# Patient Record
Sex: Female | Born: 1945 | Race: White | Hispanic: Yes | Marital: Single | Smoking: Never smoker
Health system: Southern US, Community
[De-identification: ages and names within clinical notes are randomized; demographics above are authoritative.]

## PROBLEM LIST (undated history)

## (undated) DIAGNOSIS — E669 Obesity, unspecified: Secondary | ICD-10-CM

## (undated) DIAGNOSIS — C541 Malignant neoplasm of endometrium: Secondary | ICD-10-CM

## (undated) DIAGNOSIS — M1990 Unspecified osteoarthritis, unspecified site: Secondary | ICD-10-CM

## (undated) DIAGNOSIS — R0600 Dyspnea, unspecified: Secondary | ICD-10-CM

## (undated) HISTORY — PX: BREAST SURGERY: SHX581

## (undated) HISTORY — DX: Obesity, unspecified: E66.9

## (undated) HISTORY — PX: CHOLECYSTECTOMY: SHX55

## (undated) HISTORY — PX: DILATION AND CURETTAGE OF UTERUS: SHX78

## (undated) HISTORY — DX: Malignant neoplasm of endometrium: C54.1

---

## 2020-05-01 ENCOUNTER — Other Ambulatory Visit: Payer: Self-pay

## 2020-05-01 ENCOUNTER — Encounter (HOSPITAL_BASED_OUTPATIENT_CLINIC_OR_DEPARTMENT_OTHER): Payer: Self-pay | Admitting: Obstetrics and Gynecology

## 2020-05-01 NOTE — Progress Notes (Signed)
Spoke w/ via phone for pre-op interview--- Gillian Shields RN  Lab needs dos----  T/s cbc              Lab results------none  COVID test ------10/16 at 1030 Arrive at -------1100am NPO after MN NO Solid Food.  Clear liquids from MN until--0945am- Medications to take morning of surgery -----none  Diabetic medication -----none  Patient Special Instructions -----none  Pre-Op special Istructions -----none  Patient verbalized understanding of instructions that were given at this phone interview. Patient denies shortness of breath, chest pain, fever, cough at this phone interview.

## 2020-05-04 ENCOUNTER — Other Ambulatory Visit (HOSPITAL_COMMUNITY)
Admission: RE | Admit: 2020-05-04 | Discharge: 2020-05-04 | Disposition: A | Discharge status: Home / Self Care | Payer: Medicare PPO | Source: Ambulatory Visit | Attending: Obstetrics and Gynecology | Admitting: Obstetrics and Gynecology

## 2020-05-04 DIAGNOSIS — Z01812 Encounter for preprocedural laboratory examination: Secondary | ICD-10-CM | POA: Insufficient documentation

## 2020-05-04 DIAGNOSIS — Z20822 Contact with and (suspected) exposure to covid-19: Secondary | ICD-10-CM | POA: Diagnosis not present

## 2020-05-04 LAB — SARS CORONAVIRUS 2 (TAT 6-24 HRS): SARS Coronavirus 2: NEGATIVE

## 2020-05-07 ENCOUNTER — Ambulatory Visit (HOSPITAL_BASED_OUTPATIENT_CLINIC_OR_DEPARTMENT_OTHER)
Admission: RE | Admit: 2020-05-07 | Discharge: 2020-05-07 | Disposition: A | Discharge status: Home / Self Care | Payer: Medicare PPO | Attending: Obstetrics and Gynecology | Admitting: Obstetrics and Gynecology

## 2020-05-07 ENCOUNTER — Other Ambulatory Visit: Payer: Self-pay

## 2020-05-07 ENCOUNTER — Ambulatory Visit (HOSPITAL_BASED_OUTPATIENT_CLINIC_OR_DEPARTMENT_OTHER): Payer: Medicare PPO | Admitting: Anesthesiology

## 2020-05-07 ENCOUNTER — Encounter (HOSPITAL_BASED_OUTPATIENT_CLINIC_OR_DEPARTMENT_OTHER)
Admission: RE | Disposition: A | Discharge status: Home / Self Care | Payer: Self-pay | Source: Home / Self Care | Attending: Obstetrics and Gynecology

## 2020-05-07 ENCOUNTER — Encounter (HOSPITAL_BASED_OUTPATIENT_CLINIC_OR_DEPARTMENT_OTHER): Payer: Self-pay | Admitting: Obstetrics and Gynecology

## 2020-05-07 DIAGNOSIS — N840 Polyp of corpus uteri: Secondary | ICD-10-CM | POA: Insufficient documentation

## 2020-05-07 DIAGNOSIS — Z6841 Body Mass Index (BMI) 40.0 and over, adult: Secondary | ICD-10-CM | POA: Insufficient documentation

## 2020-05-07 DIAGNOSIS — N950 Postmenopausal bleeding: Secondary | ICD-10-CM | POA: Insufficient documentation

## 2020-05-07 DIAGNOSIS — N841 Polyp of cervix uteri: Secondary | ICD-10-CM | POA: Diagnosis not present

## 2020-05-07 HISTORY — PX: HYSTEROSCOPY WITH D & C: SHX1775

## 2020-05-07 HISTORY — DX: Dyspnea, unspecified: R06.00

## 2020-05-07 LAB — CBC
HCT: 42.4 % (ref 36.0–46.0)
Hemoglobin: 13.4 g/dL (ref 12.0–15.0)
MCH: 27.7 pg (ref 26.0–34.0)
MCHC: 31.6 g/dL (ref 30.0–36.0)
MCV: 87.6 fL (ref 80.0–100.0)
Platelets: 219 10*3/uL (ref 150–400)
RBC: 4.84 MIL/uL (ref 3.87–5.11)
RDW: 13.2 % (ref 11.5–15.5)
WBC: 9.7 10*3/uL (ref 4.0–10.5)
nRBC: 0 % (ref 0.0–0.2)

## 2020-05-07 SURGERY — DILATATION AND CURETTAGE /HYSTEROSCOPY
Anesthesia: General | Site: Vagina

## 2020-05-07 MED ORDER — LIDOCAINE 2% (20 MG/ML) 5 ML SYRINGE
INTRAMUSCULAR | Status: DC | PRN
  Administered 2020-05-07: 100 mg via INTRAVENOUS

## 2020-05-07 MED ORDER — OXYCODONE HCL 5 MG/5ML PO SOLN: 5.0000 mg | Freq: Once | ORAL | Status: DC | PRN

## 2020-05-07 MED ORDER — PROPOFOL 10 MG/ML IV BOLUS
INTRAVENOUS | Status: DC | PRN
  Administered 2020-05-07: 150 mg via INTRAVENOUS

## 2020-05-07 MED ORDER — OXYCODONE HCL 5 MG PO TABS: 5.0000 mg | ORAL_TABLET | Freq: Once | ORAL | Status: DC | PRN

## 2020-05-07 MED ORDER — LIDOCAINE 2% (20 MG/ML) 5 ML SYRINGE
INTRAMUSCULAR | Status: AC
  Filled 2020-05-07: qty 5

## 2020-05-07 MED ORDER — FENTANYL CITRATE (PF) 100 MCG/2ML IJ SOLN
INTRAMUSCULAR | Status: DC | PRN
  Administered 2020-05-07 (×2): 50 ug via INTRAVENOUS

## 2020-05-07 MED ORDER — ONDANSETRON HCL 4 MG/2ML IJ SOLN
INTRAMUSCULAR | Status: AC
  Filled 2020-05-07: qty 2

## 2020-05-07 MED ORDER — SUGAMMADEX SODIUM 500 MG/5ML IV SOLN
INTRAVENOUS | Status: AC
  Filled 2020-05-07: qty 5

## 2020-05-07 MED ORDER — FENTANYL CITRATE (PF) 100 MCG/2ML IJ SOLN: 25.0000 ug | INTRAMUSCULAR | Status: DC | PRN

## 2020-05-07 MED ORDER — LACTATED RINGERS IV SOLN
INTRAVENOUS | Status: DC
  Administered 2020-05-07: 1000 mL via INTRAVENOUS

## 2020-05-07 MED ORDER — DEXAMETHASONE SODIUM PHOSPHATE 10 MG/ML IJ SOLN
INTRAMUSCULAR | Status: DC | PRN
  Administered 2020-05-07: 5 mg via INTRAVENOUS

## 2020-05-07 MED ORDER — ONDANSETRON HCL 4 MG/2ML IJ SOLN
INTRAMUSCULAR | Status: DC | PRN
  Administered 2020-05-07: 4 mg via INTRAVENOUS

## 2020-05-07 MED ORDER — ACETAMINOPHEN 10 MG/ML IV SOLN: 1000.0000 mg | Freq: Once | INTRAVENOUS | Status: DC | PRN

## 2020-05-07 MED ORDER — DEXAMETHASONE SODIUM PHOSPHATE 10 MG/ML IJ SOLN
INTRAMUSCULAR | Status: AC
  Filled 2020-05-07: qty 1

## 2020-05-07 MED ORDER — SUGAMMADEX SODIUM 200 MG/2ML IV SOLN
INTRAVENOUS | Status: DC | PRN
  Administered 2020-05-07: 240 mg via INTRAVENOUS

## 2020-05-07 MED ORDER — ROCURONIUM BROMIDE 10 MG/ML (PF) SYRINGE
PREFILLED_SYRINGE | INTRAVENOUS | Status: DC | PRN
  Administered 2020-05-07: 20 mg via INTRAVENOUS

## 2020-05-07 MED ORDER — SUCCINYLCHOLINE CHLORIDE 200 MG/10ML IV SOSY
PREFILLED_SYRINGE | INTRAVENOUS | Status: DC | PRN
  Administered 2020-05-07: 140 mg via INTRAVENOUS

## 2020-05-07 MED ORDER — PROPOFOL 10 MG/ML IV BOLUS
INTRAVENOUS | Status: AC
  Filled 2020-05-07: qty 20

## 2020-05-07 MED ORDER — SODIUM CHLORIDE 0.9 % IV SOLN
2.0000 g | INTRAVENOUS | Status: AC
  Administered 2020-05-07: 2 g via INTRAVENOUS

## 2020-05-07 MED ORDER — LACTATED RINGERS IV SOLN: INTRAVENOUS | Status: DC

## 2020-05-07 MED ORDER — SODIUM CHLORIDE (PF) 0.9 % IJ SOLN
INTRAMUSCULAR | Status: AC
  Filled 2020-05-07: qty 10

## 2020-05-07 MED ORDER — FENTANYL CITRATE (PF) 100 MCG/2ML IJ SOLN
INTRAMUSCULAR | Status: AC
  Filled 2020-05-07: qty 2

## 2020-05-07 MED ORDER — SODIUM CHLORIDE 0.9 % IV SOLN
INTRAVENOUS | Status: AC
  Filled 2020-05-07: qty 2

## 2020-05-07 MED ORDER — MIDAZOLAM HCL 2 MG/2ML IJ SOLN
INTRAMUSCULAR | Status: AC
  Filled 2020-05-07: qty 2

## 2020-05-07 SURGICAL SUPPLY — 21 items
BIPOLAR CUTTING LOOP 21FR (ELECTRODE)
CANISTER SUCT 3000ML PPV (MISCELLANEOUS) IMPLANT
CATH ROBINSON RED A/P 16FR (CATHETERS) ×2 IMPLANT
COUNTER NEEDLE 1200 MAGNETIC (NEEDLE) IMPLANT
COVER WAND RF STERILE (DRAPES) ×2 IMPLANT
DILATOR CANAL MILEX (MISCELLANEOUS) IMPLANT
DRSG TELFA 3X8 NADH (GAUZE/BANDAGES/DRESSINGS) ×2 IMPLANT
GLOVE BIO SURGEON STRL SZ 6.5 (GLOVE) ×2 IMPLANT
GLOVE BIOGEL PI IND STRL 7.5 (GLOVE) ×1 IMPLANT
GLOVE BIOGEL PI INDICATOR 7.5 (GLOVE) ×1
GLOVE SURG SS PI 7.0 STRL IVOR (GLOVE) ×2 IMPLANT
GLOVE SURG SS PI 7.5 STRL IVOR (GLOVE) ×2 IMPLANT
GOWN STRL REUS W/TWL LRG LVL3 (GOWN DISPOSABLE) ×4 IMPLANT
KIT PROCEDURE FLUENT (KITS) IMPLANT
KIT TURNOVER CYSTO (KITS) ×2 IMPLANT
LOOP CUTTING BIPOLAR 21FR (ELECTRODE) IMPLANT
NEEDLE SPNL 25GX3.5 QUINCKE BL (NEEDLE) IMPLANT
PACK VAGINAL MINOR WOMEN LF (CUSTOM PROCEDURE TRAY) ×2 IMPLANT
PAD PREP 24X48 CUFFED NSTRL (MISCELLANEOUS) ×2 IMPLANT
TOWEL OR 17X26 10 PK STRL BLUE (TOWEL DISPOSABLE) ×2 IMPLANT
WATER STERILE IRR 500ML POUR (IV SOLUTION) IMPLANT

## 2020-05-07 NOTE — Brief Op Note (Signed)
05/07/2020  1:22 PM  PATIENT:  Krystal Hill  74 y.o. female  PRE-OPERATIVE DIAGNOSIS:  POSTMENOPAUSAL BLEEDING, THICKENED ENDOMETRIUM  POST-OPERATIVE DIAGNOSIS:  POSTMENOPAUSAL BLEEDING, THICKENED ENDOMETRIUM, Endocervical polypoid mass  PROCEDURE:  Procedure(s): DILATATION AND CURETTAGE (N/A)  SURGEON:  Surgeon(s) and Role:    * Dian Queen, MD - Primary  PHYSICIAN ASSISTANT:   ASSISTANTS: none   ANESTHESIA:   MAC  EBL:  Less than 50 cc  BLOOD ADMINISTERED:none  DRAINS: none   LOCAL MEDICATIONS USED:  NONE  SPECIMEN:  Source of Specimen:  1.  endocervical polypoid mass and 2.  Endometrial currettings   DISPOSITION OF SPECIMEN:  PATHOLOGY  COUNTS:  YES  TOURNIQUET:  * No tourniquets in log *  DICTATION: .Other Dictation: Dictation Number dictated  PLAN OF CARE: Discharge to home after PACU  PATIENT DISPOSITION:  PACU - hemodynamically stable.   Delay start of Pharmacological VTE agent (>24hrs) due to surgical blood loss or risk of bleeding: not applicable

## 2020-05-07 NOTE — Anesthesia Postprocedure Evaluation (Signed)
Anesthesia Post Note  Patient: Krystal Hill  Procedure(s) Performed: DILATATION AND CURETTAGE (N/A Vagina )     Patient location during evaluation: PACU Anesthesia Type: General Level of consciousness: awake and alert and oriented Pain management: pain level controlled Vital Signs Assessment: post-procedure vital signs reviewed and stable Respiratory status: spontaneous breathing, nonlabored ventilation and respiratory function stable Cardiovascular status: blood pressure returned to baseline and stable Postop Assessment: no apparent nausea or vomiting Anesthetic complications: no   No complications documented.  Last Vitals:  Vitals:   05/07/20 1336 05/07/20 1345  BP: (!) 157/66 (!) 153/66  Pulse: 100 95  Resp: 20 (!) 22  Temp: 36.6 C   SpO2: 96% 97%    Last Pain:  Vitals:   05/07/20 1336  TempSrc:   PainSc: 0-No pain                 Karis Rilling A.

## 2020-05-07 NOTE — H&P (Signed)
74 year old female presents for D and C.  Ms. Hoelzel has not had gyn care in some time but presents with heavy postmenopausal bleeding for about 2 weeks Evaluation in the office - ultrasound 25 mm stripe  For D and C She is virginal and EMB in the office is technically not possible  Past Medical History:  Diagnosis Date  . Dyspnea    with exertion    Past Surgical History:  Procedure Laterality Date  . BREAST SURGERY    . CHOLECYSTECTOMY    . DILATION AND CURETTAGE OF UTERUS     Prior to Admission medications   Medication Sig Start Date End Date Taking? Authorizing Provider  ibuprofen (ADVIL) 100 MG/5ML suspension Take 200 mg by mouth every 4 (four) hours as needed.   Yes [provider]  naproxen sodium (ALEVE) 220 MG tablet Take 220 mg by mouth.   Yes [provider]   Allergies Patient has no allergy information on record.  History reviewed. No pertinent family history. Social History   Socioeconomic History  . Marital status: Single    Spouse name: Not on file  . Number of children: 1  . Years of education: 10  . Highest education level: Not on file  Occupational History  . Not on file  Tobacco Use  . Smoking status: Never Smoker  . Smokeless tobacco: Never Used  Vaping Use  . Vaping Use: Never used  Substance and Sexual Activity  . Alcohol use: Never  . Drug use: Never  . Sexual activity: Not on file  Other Topics Concern  . Not on file  Social History Narrative  . Not on file   Social Determinants of Health   Financial Resource Strain:   . Difficulty of Paying Living Expenses: Not on file  Food Insecurity:   . Worried About Charity fundraiser in the Last Year: Not on file  . Ran Out of Food in the Last Year: Not on file  Transportation Needs:   . Lack of Transportation (Medical): Not on file  . Lack of Transportation (Non-Medical): Not on file  Physical Activity:   . Days of Exercise per Week: Not on file  . Minutes of Exercise per  Session: Not on file  Stress:   . Feeling of Stress : Not on file  Social Connections:   . Frequency of Communication with Friends and Family: Not on file  . Frequency of Social Gatherings with Friends and Family: Not on file  . Attends Religious Services: Not on file  . Active Member of Clubs or Organizations: Not on file  . Attends Archivist Meetings: Not on file  . Marital Status: Not on file   Ht 5\' 2"  (1.575 m)   Wt 119.7 kg   BMI 48.29 kg/m  General alert and oriented Lung CTAB Car RRR No results found for this or any previous visit (from the past 24 hour(s)).  IMPRESSION: PMP Bleeding  PLAN: Exam under anesthesia D and C Risks reviewed Consent signed

## 2020-05-07 NOTE — Discharge Instructions (Signed)

## 2020-05-07 NOTE — Transfer of Care (Signed)
Immediate Anesthesia Transfer of Care Note  Patient: Krystal Hill  Procedure(s) Performed: Procedure(s) (LRB): DILATATION AND CURETTAGE (N/A)  Patient Location: PACU  Anesthesia Type: General  Level of Consciousness: awake, oriented, sedated and patient cooperative  Airway & Oxygen Therapy: Patient Spontanous Breathing and Patient connected to face mask oxygen  Post-op Assessment: Report given to PACU RN and Post -op Vital signs reviewed and stable  Post vital signs: Reviewed and stable  Complications: No apparent anesthesia complications Last Vitals:  Vitals Value Taken Time  BP 157/66 05/07/20 1337  Temp 36.6 C 05/07/20 1336  Pulse 96 05/07/20 1342  Resp 21 05/07/20 1342  SpO2 97 % 05/07/20 1342  Vitals shown include unvalidated device data.  Last Pain:  Vitals:   05/07/20 1336  TempSrc:   PainSc: 0-No pain      Patients Stated Pain Goal: 8 (57/84/69 [id number])  Complications: No complications documented.

## 2020-05-07 NOTE — Progress Notes (Signed)
H and P updated No changes Will proceed with D and C Consent signed

## 2020-05-07 NOTE — Anesthesia Procedure Notes (Addendum)
Procedure Name: Intubation Date/Time: 05/07/2020 1:00 PM Performed by: Suan Halter, CRNA Pre-anesthesia Checklist: Patient identified, Emergency Drugs available, Suction available and Patient being monitored Patient Re-evaluated:Patient Re-evaluated prior to induction Oxygen Delivery Method: Circle system utilized Preoxygenation: Pre-oxygenation with 100% oxygen Induction Type: IV induction Ventilation: Mask ventilation without difficulty Laryngoscope Size: Mac and 3 Grade View: Grade II Tube type: Oral Tube size: 7.0 mm Number of attempts: 1 Airway Equipment and Method: Stylet and Oral airway Placement Confirmation: ETT inserted through vocal cords under direct vision,  positive ETCO2 and breath sounds checked- equal and bilateral Secured at: 22 cm Tube secured with: Tape Dental Injury: Teeth and Oropharynx as per pre-operative assessment

## 2020-05-07 NOTE — Anesthesia Preprocedure Evaluation (Addendum)
Anesthesia Evaluation  Patient identified by MRN, date of birth, ID band Patient awake    Reviewed: Allergy & Precautions, NPO status , Patient's Chart, lab work & pertinent test results  Airway Mallampati: II  TM Distance: >3 FB Neck ROM: Full    Dental no notable dental hx.    Pulmonary neg pulmonary ROS,    Pulmonary exam normal breath sounds clear to auscultation + decreased breath sounds      Cardiovascular + DOE  Normal cardiovascular exam Rhythm:Regular Rate:Normal     Neuro/Psych negative neurological ROS  negative psych ROS   GI/Hepatic negative GI ROS, Neg liver ROS,   Endo/Other  Morbid obesity  Renal/GU negative Renal ROS  negative genitourinary   Musculoskeletal negative musculoskeletal ROS (+)   Abdominal (+) + obese,   Peds negative pediatric ROS (+)  Hematology negative hematology ROS (+)   Anesthesia Other Findings   Reproductive/Obstetrics negative OB ROS                             Anesthesia Physical Anesthesia Plan  ASA: III  Anesthesia Plan: General   Post-op Pain Management:    Induction: Intravenous  PONV Risk Score and Plan: 3 and Ondansetron, Dexamethasone and Treatment may vary due to age or medical condition  Airway Management Planned: Oral ETT  Additional Equipment:   Intra-op Plan:   Post-operative Plan: Extubation in OR  Informed Consent: I have reviewed the patients History and Physical, chart, labs and discussed the procedure including the risks, benefits and alternatives for the proposed anesthesia with the patient or authorized representative who has indicated his/her understanding and acceptance.     Dental advisory given  Plan Discussed with: CRNA and Surgeon  Anesthesia Plan Comments: (Crackers at 0830)       Anesthesia Quick Evaluation

## 2020-05-08 ENCOUNTER — Encounter (HOSPITAL_BASED_OUTPATIENT_CLINIC_OR_DEPARTMENT_OTHER): Payer: Self-pay | Admitting: Obstetrics and Gynecology

## 2020-05-08 LAB — SURGICAL PATHOLOGY

## 2020-05-08 NOTE — Op Note (Signed)
[name], [name] MEDICAL RECORD [id number] ACCOUNT [id number] DATE OF BIRTH:[birth year] FACILITY: [facility] LOCATION: [facility] PHYSICIAN:Jin Capote Lynett Fish, MD  OPERATIVE REPORT  DATE OF PROCEDURE:  05/07/2020  PREOPERATIVE DIAGNOSES:  Thickened endometrium and postmenopausal bleeding.  POSTOPERATIVE DIAGNOSIS:  Thickened endometrium, postmenopausal bleeding, large endocervical polypoid mass.  PROCEDURE:  D and C, removal of endocervical polypoid mass as well.  SURGEON:  Dian Queen, MD  ANESTHESIA:  MAC.  ESTIMATED BLOOD LOSS:  Less than 50 mL.  COMPLICATIONS:  None.  DESCRIPTION OF PROCEDURE:  The patient was taken to the operating room after she was consented.  She was administered anesthesia and placed in the lithotomy position.  She was prepped and draped and a timeout was performed.  An in and out catheter was  used to empty the bladder.  Speculum was inserted into the vagina and inside the vagina ____ polypoid mass appeared to be coming out of her cervical external os.  The size of the mass appeared to be may be 2.5 cm and it did not appear to be friable.  I  used polyp forceps to remove the polypoid mass that was visualized coming out of the cervical os.  This was send to pathology.  I then grasped ____ cervical anterior lip with a tenaculum and dilated the cervical internal os using the [facility] dilators.  I  then inserted the sharp curette ____ that appeared to be arising from the anterior lower uterine segment of the uterus.  ____ and tissue was removed.  I then inserted polyp forceps and removed the remainder of the tissue.  The tissue did not appear  grossly to be polypoid is what I saw that was visualized ____.  At the end of the procedure, minimal bleeding was noted.  All sponge, lap and instrument counts were correct x2.  The patient went to recovery room in stable condition.  HN/NUANCE  D:05/07/2020 T:05/07/2020 JOB:[id number]

## 2020-05-08 NOTE — Addendum Note (Signed)
Addendum  created 05/08/20 0752 by Suan Halter, CRNA   Charge Capture section accepted

## 2020-09-04 NOTE — Progress Notes (Addendum)
COVID Vaccine Completed:  x3 Date COVID Vaccine completed:  08-24-19 09-14-19 Has received booster:  05-19-20 COVID vaccine manufacturer: [employer]   Date of COVID positive in last 90 days:  N/A  PCP - No PCP Cardiologist - N/A  Chest x-ray - N/A EKG - N/A Stress Test -  ECHO -  Cardiac Cath -  Pacemaker/ICD device last checked:  Sleep Study - N/A CPAP -   Fasting Blood Sugar - N/A Checks Blood Sugar _____ times a day  Blood Thinner Instructions:N/A Aspirin Instructions: Last Dose:  Activity level:  Can go up a flight of stairs and perform activities of daily living without stopping and without symptoms     Anesthesia review: N/A  Patient denies shortness of breath, fever, cough and chest pain at PAT appointment   Patient verbalized understanding of instructions that were given to them at the PAT appointment. Patient was also instructed that they will need to review over the PAT instructions again at home before surgery.

## 2020-09-04 NOTE — Patient Instructions (Addendum)
DUE TO COVID-19 ONLY ONE VISITOR IS ALLOWED TO COME WITH YOU AND STAY IN THE WAITING ROOM ONLY DURING PRE OP AND PROCEDURE.   IF YOU WILL BE ADMITTED INTO THE HOSPITAL YOU ARE ALLOWED ONE SUPPORT PERSON DURING VISITATION HOURS ONLY (10AM -8PM)   . The support person may change daily. . The support person must pass our screening, gel in and out, and wear a mask at all times, including in the patient's room. . Patients must also wear a mask when staff or their support person are in the room.        Your procedure is scheduled on: Wednesday, 09-09-20   Report to [facility] Main  Entrance   Report to Short Stay at 5:30 AM   [facility])   Call this number if you have problems the morning of surgery [phone]   Do not eat food :After Midnight.   May have liquids until  4:15 AM day of surgery  CLEAR LIQUID DIET  Foods Allowed                                                                     Foods Excluded  Water, Black Coffee and tea, regular and decaf              liquids that you cannot  Plain Jell-O in any flavor  (No red)                                     see through such as: Fruit ices (not with fruit pulp)                                      milk, soups, orange juice              Iced Popsicles (No red)                                      All solid food                                   Apple juices Sports drinks like Gatorade (No red) Lightly seasoned clear broth or consume(fat free)  Sugar, honey syrup       Oral Hygiene is also important to reduce your risk of infection.                                    Remember - BRUSH YOUR TEETH THE MORNING OF SURGERY WITH YOUR REGULAR TOOTHPASTE   Do NOT smoke after Midnight   Take these medicines the morning of surgery with A SIP OF WATER: None                               You may not have any metal on  your body including hair pins, jewelry, and body piercings             Do not wear make-up, lotions, powders,  perfumes/cologne, or deodorant             Do not wear nail polish.  Do not shave  48 hours prior to surgery.    Do not bring valuables to the hospital. [place].   Contacts, dentures or bridgework may not be worn into surgery.   Bring small overnight bag day of surgery.                 Please read over the following fact sheets you were given: IF YOU HAVE QUESTIONS ABOUT YOUR PRE OP INSTRUCTIONS PLEASE CALL  [facility] - Preparing for Surgery Before surgery, you can play an important role.  Because skin is not sterile, your skin needs to be as free of germs as possible.  You can reduce the number of germs on your skin by washing with CHG (chlorahexidine gluconate) soap before surgery.  CHG is an antiseptic cleaner which kills germs and bonds with the skin to continue killing germs even after washing. Please DO NOT use if you have an allergy to CHG or antibacterial soaps.  If your skin becomes reddened/irritated stop using the CHG and inform your nurse when you arrive at Short Stay. Do not shave (including legs and underarms) for at least 48 hours prior to the first CHG shower.  You may shave your face/neck.  Please follow these instructions carefully:  1.  Shower with CHG Soap the night before surgery and the  morning of surgery.  2.  If you choose to wash your hair, wash your hair first as usual with your normal  shampoo.  3.  After you shampoo, rinse your hair and body thoroughly to remove the shampoo.                             4.  Use CHG as you would any other liquid soap.  You can apply chg directly to the skin and wash.  Gently with a scrungie or clean washcloth.  5.  Apply the CHG Soap to your body ONLY FROM THE NECK DOWN.   Do   not use on face/ open                           Wound or open sores. Avoid contact with eyes, ears mouth and   genitals (private parts).                       Wash face,  Genitals  (private parts) with your normal soap.             6.  Wash thoroughly, paying special attention to the area where your    surgery  will be performed.  7.  Thoroughly rinse your body with warm water from the neck down.  8.  DO NOT shower/wash with your normal soap after using and rinsing off the CHG Soap.                9.  Pat yourself dry with a clean towel.            10.  Wear clean pajamas.            11.  Place clean sheets on your bed the night of your first shower and do not  sleep with pets. Day of Surgery : Do not apply any lotions/deodorants the morning of surgery.  Please wear clean clothes to the hospital/surgery center.  FAILURE TO FOLLOW THESE INSTRUCTIONS MAY RESULT IN THE CANCELLATION OF YOUR SURGERY  PATIENT SIGNATURE_________________________________  NURSE SIGNATURE__________________________________  ________________________________________________________________________  WHAT IS A BLOOD TRANSFUSION? Blood Transfusion Information  A transfusion is the replacement of blood or some of its parts. Blood is made up of multiple cells which provide different functions.  Red blood cells carry oxygen and are used for blood loss replacement.  White blood cells fight against infection.  Platelets control bleeding.  Plasma helps clot blood.  Other blood products are available for specialized needs, such as hemophilia or other clotting disorders. BEFORE THE TRANSFUSION  Who gives blood for transfusions?   Healthy volunteers who are fully evaluated to make sure their blood is safe. This is blood bank blood. Transfusion therapy is the safest it has ever been in the practice of medicine. Before blood is taken from a donor, a complete history is taken to make sure that person has no history of diseases nor engages in risky social behavior (examples are intravenous drug use or sexual activity with multiple partners). The donor's travel history is screened to minimize risk of  transmitting infections, such as malaria. The donated blood is tested for signs of infectious diseases, such as HIV and hepatitis. The blood is then tested to be sure it is compatible with you in order to minimize the chance of a transfusion reaction. If you or a relative donates blood, this is often done in anticipation of surgery and is not appropriate for emergency situations. It takes many days to process the donated blood. RISKS AND COMPLICATIONS Although transfusion therapy is very safe and saves many lives, the main dangers of transfusion include:   Getting an infectious disease.  Developing a transfusion reaction. This is an allergic reaction to something in the blood you were given. Every precaution is taken to prevent this. The decision to have a blood transfusion has been considered carefully by your caregiver before blood is given. Blood is not given unless the benefits outweigh the risks. AFTER THE TRANSFUSION  Right after receiving a blood transfusion, you will usually feel much better and more energetic. This is especially true if your red blood cells have gotten low (anemic). The transfusion raises the level of the red blood cells which carry oxygen, and this usually causes an energy increase.  The nurse administering the transfusion will monitor you carefully for complications. HOME CARE INSTRUCTIONS  No special instructions are needed after a transfusion. You may find your energy is better. Speak with your caregiver about any limitations on activity for underlying diseases you may have. SEEK MEDICAL CARE IF:   Your condition is not improving after your transfusion.  You develop redness or irritation at the intravenous (IV) site. SEEK IMMEDIATE MEDICAL CARE IF:  Any of the following symptoms occur over the next 12 hours:  Shaking chills.  You have a temperature by mouth above 102 F (38.9 C), not controlled by medicine.  Chest, back, or muscle pain.  People around you  feel you are not acting correctly or are confused.  Shortness of breath or difficulty breathing.  Dizziness and fainting.  You get a rash or develop hives.  You have a decrease in urine output.  Your urine turns a dark color or changes to pink, red, or brown. Any of the following symptoms occur over the next 10 days:  You have a temperature by mouth above 102 F (38.9 C), not controlled by medicine.  Shortness of breath.  Weakness after normal activity.  The white part of the eye turns yellow (jaundice).  You have a decrease in the amount of urine or are urinating less often.  Your urine turns a dark color or changes to pink, red, or brown. Document Released: 07/01/2000 Document Revised: 09/26/2011 Document Reviewed: 02/18/2008 [facility] Patient Information 2014 [place], [place].  _______________________________________________________________________

## 2020-09-08 ENCOUNTER — Encounter (HOSPITAL_COMMUNITY)
Admission: RE | Admit: 2020-09-08 | Discharge: 2020-09-08 | Disposition: A | Discharge status: Home / Self Care | Payer: Medicare PPO | Source: Ambulatory Visit | Attending: Obstetrics and Gynecology | Admitting: Obstetrics and Gynecology

## 2020-09-08 ENCOUNTER — Encounter (HOSPITAL_COMMUNITY): Payer: Self-pay

## 2020-09-08 ENCOUNTER — Other Ambulatory Visit: Payer: Self-pay

## 2020-09-08 ENCOUNTER — Other Ambulatory Visit (HOSPITAL_COMMUNITY)
Admission: RE | Admit: 2020-09-08 | Discharge: 2020-09-08 | Disposition: A | Discharge status: Home / Self Care | Payer: Medicare PPO | Source: Ambulatory Visit | Attending: Obstetrics and Gynecology | Admitting: Obstetrics and Gynecology

## 2020-09-08 DIAGNOSIS — Z20822 Contact with and (suspected) exposure to covid-19: Secondary | ICD-10-CM | POA: Insufficient documentation

## 2020-09-08 DIAGNOSIS — Z01812 Encounter for preprocedural laboratory examination: Secondary | ICD-10-CM | POA: Insufficient documentation

## 2020-09-08 HISTORY — DX: Unspecified osteoarthritis, unspecified site: M19.90

## 2020-09-08 LAB — CBC
HCT: 35.9 % — ABNORMAL LOW (ref 36.0–46.0)
Hemoglobin: 11 g/dL — ABNORMAL LOW (ref 12.0–15.0)
MCH: 25.4 pg — ABNORMAL LOW (ref 26.0–34.0)
MCHC: 30.6 g/dL (ref 30.0–36.0)
MCV: 82.9 fL (ref 80.0–100.0)
Platelets: 262 10*3/uL (ref 150–400)
RBC: 4.33 MIL/uL (ref 3.87–5.11)
RDW: 14.2 % (ref 11.5–15.5)
WBC: 11.8 10*3/uL — ABNORMAL HIGH (ref 4.0–10.5)
nRBC: 0 % (ref 0.0–0.2)

## 2020-09-08 LAB — SARS CORONAVIRUS 2 (TAT 6-24 HRS): SARS Coronavirus 2: NEGATIVE

## 2020-09-08 NOTE — Anesthesia Preprocedure Evaluation (Addendum)
Anesthesia Evaluation  Patient identified by MRN, date of birth, ID band Patient awake    Reviewed: Allergy & Precautions, NPO status , Patient's Chart, lab work & pertinent test results  Airway Mallampati: III  TM Distance: >3 FB Neck ROM: Full    Dental no notable dental hx. (+) Teeth Intact, Dental Advisory Given   Pulmonary neg pulmonary ROS,    Pulmonary exam normal breath sounds clear to auscultation       Cardiovascular Exercise Tolerance: Good negative cardio ROS Normal cardiovascular exam Rhythm:Regular Rate:Normal     Neuro/Psych negative neurological ROS  negative psych ROS   GI/Hepatic negative GI ROS, Neg liver ROS,   Endo/Other  negative endocrine ROS  Renal/GU negative Renal ROS     Musculoskeletal  (+) Arthritis ,   Abdominal (+) + obese,   Peds  Hematology Lab Results      Component                Value               Date                      WBC                      11.8 (H)            09/08/2020                HGB                      11.0 (L)            09/08/2020                HCT                      35.9 (L)            09/08/2020                MCV                      82.9                09/08/2020                PLT                      262                 09/08/2020              Anesthesia Other Findings   Reproductive/Obstetrics                            Anesthesia Physical Anesthesia Plan  ASA: III  Anesthesia Plan: General   Post-op Pain Management:    Induction: Intravenous  PONV Risk Score and Plan: 4 or greater and Treatment may vary due to age or medical condition, Ondansetron and Dexamethasone  Airway Management Planned: Oral ETT  Additional Equipment: None  Intra-op Plan:   Post-operative Plan: Extubation in OR  Informed Consent: I have reviewed the patients History and Physical, chart, labs and discussed the procedure including the  risks, benefits and alternatives for the proposed anesthesia with the patient or authorized representative who has indicated his/her  understanding and acceptance.     Dental advisory given  Plan Discussed with: CRNA  Anesthesia Plan Comments: (GA w lidocaine infusion + Ketamine 0.5 mg/kg)       Anesthesia Quick Evaluation

## 2020-09-09 ENCOUNTER — Inpatient Hospital Stay (HOSPITAL_COMMUNITY): Payer: Medicare PPO | Admitting: Certified Registered"

## 2020-09-09 ENCOUNTER — Encounter (HOSPITAL_COMMUNITY)
Admission: RE | Disposition: A | Discharge status: Home / Self Care | Payer: Self-pay | Source: Home / Self Care | Attending: Obstetrics and Gynecology

## 2020-09-09 ENCOUNTER — Encounter (HOSPITAL_COMMUNITY): Payer: Self-pay | Admitting: Obstetrics and Gynecology

## 2020-09-09 ENCOUNTER — Other Ambulatory Visit: Payer: Self-pay

## 2020-09-09 ENCOUNTER — Inpatient Hospital Stay (HOSPITAL_COMMUNITY)
Admission: RE | Admit: 2020-09-09 | Discharge: 2020-09-11 | DRG: 740 | Disposition: A | Discharge status: Home / Self Care | Payer: Medicare PPO | Attending: Obstetrics and Gynecology | Admitting: Obstetrics and Gynecology

## 2020-09-09 DIAGNOSIS — Z79899 Other long term (current) drug therapy: Secondary | ICD-10-CM

## 2020-09-09 DIAGNOSIS — N950 Postmenopausal bleeding: Secondary | ICD-10-CM | POA: Diagnosis present

## 2020-09-09 DIAGNOSIS — Z20822 Contact with and (suspected) exposure to covid-19: Secondary | ICD-10-CM | POA: Diagnosis present

## 2020-09-09 DIAGNOSIS — Z9049 Acquired absence of other specified parts of digestive tract: Secondary | ICD-10-CM

## 2020-09-09 DIAGNOSIS — Z6841 Body Mass Index (BMI) 40.0 and over, adult: Secondary | ICD-10-CM | POA: Diagnosis not present

## 2020-09-09 DIAGNOSIS — C541 Malignant neoplasm of endometrium: Secondary | ICD-10-CM | POA: Diagnosis present

## 2020-09-09 HISTORY — PX: HYSTERECTOMY ABDOMINAL WITH SALPINGO-OOPHORECTOMY: SHX6792

## 2020-09-09 LAB — ABO/RH: ABO/RH(D): O POS

## 2020-09-09 LAB — TYPE AND SCREEN
ABO/RH(D): O POS
Antibody Screen: NEGATIVE

## 2020-09-09 SURGERY — HYSTERECTOMY, ABDOMINAL, WITH SALPINGO-OOPHORECTOMY
Anesthesia: General | Laterality: Bilateral

## 2020-09-09 MED ORDER — LACTATED RINGERS IV SOLN: INTRAVENOUS | Status: DC

## 2020-09-09 MED ORDER — SUGAMMADEX SODIUM 200 MG/2ML IV SOLN
INTRAVENOUS | Status: DC | PRN
  Administered 2020-09-09: 240 mg via INTRAVENOUS

## 2020-09-09 MED ORDER — OXYCODONE HCL 5 MG PO TABS: 5.0000 mg | ORAL_TABLET | Freq: Once | ORAL | Status: DC | PRN

## 2020-09-09 MED ORDER — HYDROMORPHONE 1 MG/ML IV SOLN: INTRAVENOUS | Status: DC

## 2020-09-09 MED ORDER — KETOROLAC TROMETHAMINE 30 MG/ML IJ SOLN
INTRAMUSCULAR | Status: AC
  Filled 2020-09-09: qty 1

## 2020-09-09 MED ORDER — OXYCODONE HCL 5 MG PO TABS
ORAL_TABLET | ORAL | Status: AC
  Filled 2020-09-09: qty 1

## 2020-09-09 MED ORDER — KETOROLAC TROMETHAMINE 30 MG/ML IJ SOLN
30.0000 mg | Freq: Once | INTRAMUSCULAR | Status: AC
  Administered 2020-09-09: 30 mg via INTRAVENOUS

## 2020-09-09 MED ORDER — FENTANYL CITRATE (PF) 250 MCG/5ML IJ SOLN
INTRAMUSCULAR | Status: DC | PRN
  Administered 2020-09-09: 100 ug via INTRAVENOUS

## 2020-09-09 MED ORDER — SODIUM CHLORIDE 0.9 % IR SOLN
Status: DC | PRN
  Administered 2020-09-09: 1000 mL

## 2020-09-09 MED ORDER — OXYCODONE HCL 5 MG/5ML PO SOLN: 5.0000 mg | Freq: Once | ORAL | Status: DC | PRN

## 2020-09-09 MED ORDER — ACETAMINOPHEN 10 MG/ML IV SOLN
INTRAVENOUS | Status: AC
  Administered 2020-09-09: 1000 mg via INTRAVENOUS
  Filled 2020-09-09: qty 100

## 2020-09-09 MED ORDER — ONDANSETRON HCL 4 MG/2ML IJ SOLN: 4.0000 mg | Freq: Four times a day (QID) | INTRAMUSCULAR | Status: DC | PRN

## 2020-09-09 MED ORDER — KETAMINE HCL 10 MG/ML IJ SOLN
INTRAMUSCULAR | Status: AC
  Filled 2020-09-09: qty 1

## 2020-09-09 MED ORDER — BUPIVACAINE HCL 0.25 % IJ SOLN
INTRAMUSCULAR | Status: AC
  Filled 2020-09-09: qty 1

## 2020-09-09 MED ORDER — ONDANSETRON HCL 4 MG/2ML IJ SOLN
INTRAMUSCULAR | Status: DC | PRN
  Administered 2020-09-09: 4 mg via INTRAVENOUS

## 2020-09-09 MED ORDER — LIDOCAINE HCL 2 % IJ SOLN
INTRAMUSCULAR | Status: AC
  Filled 2020-09-09: qty 20

## 2020-09-09 MED ORDER — LIDOCAINE 2% (20 MG/ML) 5 ML SYRINGE
INTRAMUSCULAR | Status: DC | PRN
  Administered 2020-09-09: 100 mg via INTRAVENOUS

## 2020-09-09 MED ORDER — BUPIVACAINE HCL (PF) 0.25 % IJ SOLN
INTRAMUSCULAR | Status: DC | PRN
  Administered 2020-09-09: 20 mL

## 2020-09-09 MED ORDER — AMISULPRIDE (ANTIEMETIC) 5 MG/2ML IV SOLN: 10.0000 mg | Freq: Once | INTRAVENOUS | Status: DC | PRN

## 2020-09-09 MED ORDER — DEXAMETHASONE SODIUM PHOSPHATE 10 MG/ML IJ SOLN
INTRAMUSCULAR | Status: DC | PRN
  Administered 2020-09-09: 8 mg via INTRAVENOUS

## 2020-09-09 MED ORDER — LIDOCAINE HCL (PF) 2 % IJ SOLN
INTRAMUSCULAR | Status: DC | PRN
  Administered 2020-09-09: 1.5 mg/kg/h via INTRADERMAL

## 2020-09-09 MED ORDER — OXYCODONE HCL 5 MG PO TABS
5.0000 mg | ORAL_TABLET | ORAL | Status: DC | PRN
  Filled 2020-09-09: qty 2

## 2020-09-09 MED ORDER — DIPHENHYDRAMINE HCL 12.5 MG/5ML PO ELIX: 12.5000 mg | ORAL_SOLUTION | Freq: Four times a day (QID) | ORAL | Status: DC | PRN

## 2020-09-09 MED ORDER — KETAMINE HCL 10 MG/ML IJ SOLN
INTRAMUSCULAR | Status: DC | PRN
  Administered 2020-09-09: 25 mg via INTRAVENOUS

## 2020-09-09 MED ORDER — LIDOCAINE HCL (PF) 2 % IJ SOLN
INTRAMUSCULAR | Status: AC
  Filled 2020-09-09: qty 5

## 2020-09-09 MED ORDER — TRAMADOL HCL 50 MG PO TABS: 50.0000 mg | ORAL_TABLET | Freq: Four times a day (QID) | ORAL | Status: DC | PRN

## 2020-09-09 MED ORDER — PROPOFOL 10 MG/ML IV BOLUS
INTRAVENOUS | Status: AC
  Filled 2020-09-09: qty 40

## 2020-09-09 MED ORDER — ACETAMINOPHEN 10 MG/ML IV SOLN
1000.0000 mg | Freq: Once | INTRAVENOUS | Status: DC | PRN
Start: 2020-09-09 — End: 2020-09-09

## 2020-09-09 MED ORDER — CHLORHEXIDINE GLUCONATE 0.12 % MT SOLN: 15.0000 mL | Freq: Once | OROMUCOSAL | Status: AC

## 2020-09-09 MED ORDER — LABETALOL HCL 5 MG/ML IV SOLN
INTRAVENOUS | Status: AC
  Filled 2020-09-09: qty 4

## 2020-09-09 MED ORDER — POVIDONE-IODINE 10 % EX SWAB
2.0000 "application " | Freq: Once | CUTANEOUS | Status: AC
  Administered 2020-09-09: 2 via TOPICAL

## 2020-09-09 MED ORDER — DIPHENHYDRAMINE HCL 50 MG/ML IJ SOLN: 12.5000 mg | Freq: Four times a day (QID) | INTRAMUSCULAR | Status: DC | PRN

## 2020-09-09 MED ORDER — HYDROMORPHONE HCL 1 MG/ML IJ SOLN
INTRAMUSCULAR | Status: AC
  Administered 2020-09-09: 0.25 mg via INTRAVENOUS
  Filled 2020-09-09: qty 1

## 2020-09-09 MED ORDER — FENTANYL CITRATE (PF) 100 MCG/2ML IJ SOLN
INTRAMUSCULAR | Status: AC
  Filled 2020-09-09: qty 2

## 2020-09-09 MED ORDER — SIMETHICONE 80 MG PO CHEW: 80.0000 mg | CHEWABLE_TABLET | Freq: Four times a day (QID) | ORAL | Status: DC | PRN

## 2020-09-09 MED ORDER — SODIUM CHLORIDE 0.9% FLUSH: 9.0000 mL | INTRAVENOUS | Status: DC | PRN

## 2020-09-09 MED ORDER — DEXAMETHASONE SODIUM PHOSPHATE 10 MG/ML IJ SOLN
INTRAMUSCULAR | Status: AC
  Filled 2020-09-09: qty 1

## 2020-09-09 MED ORDER — ROCURONIUM BROMIDE 10 MG/ML (PF) SYRINGE
PREFILLED_SYRINGE | INTRAVENOUS | Status: DC | PRN
  Administered 2020-09-09: 70 mg via INTRAVENOUS
  Administered 2020-09-09 (×2): 5 mg via INTRAVENOUS

## 2020-09-09 MED ORDER — ONDANSETRON HCL 4 MG/2ML IJ SOLN
INTRAMUSCULAR | Status: AC
  Filled 2020-09-09: qty 2

## 2020-09-09 MED ORDER — MEPERIDINE HCL 50 MG/ML IJ SOLN: 6.2500 mg | INTRAMUSCULAR | Status: DC | PRN

## 2020-09-09 MED ORDER — SODIUM CHLORIDE 0.9 % IV SOLN
2.0000 g | INTRAVENOUS | Status: AC
  Administered 2020-09-09: 2 g via INTRAVENOUS
  Filled 2020-09-09: qty 2

## 2020-09-09 MED ORDER — ORAL CARE MOUTH RINSE
15.0000 mL | Freq: Once | OROMUCOSAL | Status: AC
  Administered 2020-09-09: 15 mL via OROMUCOSAL

## 2020-09-09 MED ORDER — PROPOFOL 10 MG/ML IV BOLUS
INTRAVENOUS | Status: DC | PRN
  Administered 2020-09-09: 160 mg via INTRAVENOUS

## 2020-09-09 MED ORDER — IBUPROFEN 400 MG PO TABS
800.0000 mg | ORAL_TABLET | Freq: Three times a day (TID) | ORAL | Status: DC
  Administered 2020-09-09 – 2020-09-10 (×3): 800 mg via ORAL
  Filled 2020-09-09 (×7): qty 2

## 2020-09-09 MED ORDER — SUGAMMADEX SODIUM 500 MG/5ML IV SOLN
INTRAVENOUS | Status: AC
  Filled 2020-09-09: qty 5

## 2020-09-09 MED ORDER — ONDANSETRON HCL 4 MG/2ML IJ SOLN: 4.0000 mg | Freq: Once | INTRAMUSCULAR | Status: DC | PRN

## 2020-09-09 MED ORDER — NALOXONE HCL 0.4 MG/ML IJ SOLN
0.4000 mg | INTRAMUSCULAR | Status: DC | PRN
Start: 2020-09-09 — End: 2020-09-09

## 2020-09-09 MED ORDER — HYDROMORPHONE HCL 1 MG/ML IJ SOLN
0.2500 mg | INTRAMUSCULAR | Status: DC | PRN
  Administered 2020-09-09 (×4): 0.25 mg via INTRAVENOUS

## 2020-09-09 MED ORDER — LABETALOL HCL 5 MG/ML IV SOLN
INTRAVENOUS | Status: DC | PRN
  Administered 2020-09-09: 5 mg via INTRAVENOUS

## 2020-09-09 MED ORDER — ROCURONIUM BROMIDE 10 MG/ML (PF) SYRINGE
PREFILLED_SYRINGE | INTRAVENOUS | Status: AC
  Filled 2020-09-09: qty 10

## 2020-09-09 MED ORDER — DOCUSATE SODIUM 100 MG PO CAPS
100.0000 mg | ORAL_CAPSULE | Freq: Two times a day (BID) | ORAL | Status: DC
  Filled 2020-09-09 (×4): qty 1

## 2020-09-09 MED ORDER — MENTHOL 3 MG MT LOZG
1.0000 | LOZENGE | OROMUCOSAL | Status: DC | PRN
Start: 2020-09-09 — End: 2020-09-11

## 2020-09-09 SURGICAL SUPPLY — 39 items
BENZOIN TINCTURE PRP APPL 2/3 (GAUZE/BANDAGES/DRESSINGS) ×2 IMPLANT
CANISTER SUCT 3000ML PPV (MISCELLANEOUS) IMPLANT
CHLORAPREP W/TINT 26 (MISCELLANEOUS) ×2 IMPLANT
CLSR STERI-STRIP ANTIMIC 1/2X4 (GAUZE/BANDAGES/DRESSINGS) ×2 IMPLANT
COVER WAND RF STERILE (DRAPES) IMPLANT
DECANTER SPIKE VIAL GLASS SM (MISCELLANEOUS) ×2 IMPLANT
DERMABOND ADVANCED (GAUZE/BANDAGES/DRESSINGS) ×1
DERMABOND ADVANCED .7 DNX12 (GAUZE/BANDAGES/DRESSINGS) ×1 IMPLANT
DRAPE CESAREAN BIRTH W POUCH (DRAPES) ×2 IMPLANT
DRAPE WARM FLUID 44X44 (DRAPES) IMPLANT
DRSG OPSITE POSTOP 4X10 (GAUZE/BANDAGES/DRESSINGS) ×2 IMPLANT
GAUZE 4X4 16PLY RFD (DISPOSABLE) IMPLANT
GLOVE SURG ENC MOIS LTX SZ6.5 (GLOVE) ×2 IMPLANT
GLOVE SURG UNDER POLY LF SZ7 (GLOVE) ×4 IMPLANT
GOWN STRL REUS W/TWL LRG LVL3 (GOWN DISPOSABLE) ×6 IMPLANT
KIT TURNOVER KIT A (KITS) ×2 IMPLANT
NEEDLE HYPO 22GX1.5 SAFETY (NEEDLE) ×2 IMPLANT
NS IRRIG 1000ML POUR BTL (IV SOLUTION) ×2 IMPLANT
PACK ABDOMINAL GYN (CUSTOM PROCEDURE TRAY) ×2 IMPLANT
PAD OB MATERNITY 4.3X12.25 (PERSONAL CARE ITEMS) ×2 IMPLANT
PENCIL SMOKE EVAC W/HOLSTER (ELECTROSURGICAL) ×2 IMPLANT
PENCIL SMOKE EVACUATOR (MISCELLANEOUS) ×2 IMPLANT
PROTECTOR NERVE ULNAR (MISCELLANEOUS) IMPLANT
SEPRAFILM MEMBRANE 5X6 (MISCELLANEOUS) IMPLANT
SPONGE LAP 18X18 RF (DISPOSABLE) ×2 IMPLANT
STAPLER VISISTAT 35W (STAPLE) IMPLANT
SUT PDS AB 0 CT 36 (SUTURE) IMPLANT
SUT PDS AB 0 CTX 60 (SUTURE) IMPLANT
SUT PLAIN 2 0 XLH (SUTURE) IMPLANT
SUT VIC AB 0 CT1 18XCR BRD8 (SUTURE) ×6 IMPLANT
SUT VIC AB 0 CT1 27 (SUTURE) ×4
SUT VIC AB 0 CT1 27XBRD ANBCTR (SUTURE) ×4 IMPLANT
SUT VIC AB 0 CT1 36 (SUTURE) ×2 IMPLANT
SUT VIC AB 0 CT1 8-18 (SUTURE) ×6
SUT VIC AB 4-0 KS 27 (SUTURE) ×4 IMPLANT
SUT VICRYL 0 TIES 12 18 (SUTURE) ×2 IMPLANT
SYR CONTROL 10ML LL (SYRINGE) ×2 IMPLANT
TOWEL OR 17X26 10 PK STRL BLUE (TOWEL DISPOSABLE) ×4 IMPLANT
TRAY FOLEY MTR SLVR 14FR STAT (SET/KITS/TRAYS/PACK) ×2 IMPLANT

## 2020-09-09 NOTE — Brief Op Note (Signed)
09/09/2020  9:52 AM  PATIENT:  Krystal Hill  75 y.o. female  PRE-OPERATIVE DIAGNOSIS:   Post menopausal bleeding  POST-OPERATIVE DIAGNOSIS:  FIGO Grade 1 Endometrial Carcinoma   PROCEDURE:  Procedure(s): TOTAL ABDOMINAL HYSTERECTOMY WITH BILATERAL SALPINGO-OOPHORECTOMY WITH FROZEN SECTION (Bilateral)  SURGEON:  Surgeon(s) and Role:    Dian Queen, MD - Primary  PHYSICIAN ASSISTANT:   ASSISTANTS: Dr Mardelle Matte    ANESTHESIA:   general  EBL:  450 mL   BLOOD ADMINISTERED:none  DRAINS: Urinary Catheter (Foley)   LOCAL MEDICATIONS USED:  LIDOCAINE   SPECIMEN:  Source of Specimen:  uterus, tubes, cervix and ovaries  DISPOSITION OF SPECIMEN:  PATHOLOGY  COUNTS:  YES  TOURNIQUET:  * No tourniquets in log *  DICTATION: .Other Dictation: Dictation Number dictated  PLAN OF CARE: Admit to inpatient   PATIENT DISPOSITION:  PACU - hemodynamically stable.   Delay start of Pharmacological VTE agent (>24hrs) due to surgical blood loss or risk of bleeding: not applicable

## 2020-09-09 NOTE — H&P (Signed)
75 year old G 0 with PMP bleeding . Had this in October and had D and C and removal of endometrial polyps. Pathology benign  Past Medical History:  Diagnosis Date  . Arthritis   . Dyspnea    with exertion    Past Surgical History:  Procedure Laterality Date  . BREAST SURGERY    . CHOLECYSTECTOMY    . DILATION AND CURETTAGE OF UTERUS    . HYSTEROSCOPY WITH D & C N/A 05/07/2020   Procedure: DILATATION AND CURETTAGE;  Surgeon: Dian Queen, MD;  Location: [facility];  Service: Gynecology;  Laterality: N/A;   Prior to Admission medications   Medication Sig Start Date End Date Taking? Authorizing Provider  megestrol (MEGACE) 40 MG tablet Take 40 mg by mouth 2 (two) times daily. 09/02/20  Yes [provider]  ibuprofen (ADVIL) 100 MG/5ML suspension Take 200 mg by mouth every 4 (four) hours as needed. Patient not taking: Reported on 09/04/2020    [provider]  naproxen sodium (ALEVE) 220 MG tablet Take 220 mg by mouth. Patient not taking: Reported on 09/04/2020    [provider]   Allergies NKDA History reviewed. No pertinent family history. Social History   Socioeconomic History  . Marital status: Single    Spouse name: Not on file  . Number of children: 1  . Years of education: 84  . Highest education level: Not on file  Occupational History  . Not on file  Tobacco Use  . Smoking status: Never Smoker  . Smokeless tobacco: Never Used  Vaping Use  . Vaping Use: Never used  Substance and Sexual Activity  . Alcohol use: Never  . Drug use: Never  . Sexual activity: Not on file  Other Topics Concern  . Not on file  Social History Narrative  . Not on file   Social Determinants of Health   Financial Resource Strain: Not on file  Food Insecurity: Not on file  Transportation Needs: Not on file  Physical Activity: Not on file  Stress: Not on file  Social Connections: Not on file   BP (!) 168/88   Pulse (!) 103   Temp 98.6 F  (37 C) (Oral)   Resp 18   SpO2 97%  Results for orders placed or performed during the hospital encounter of 09/09/20 (from the past 24 hour(s))  ABO/Rh     Status: None (Preliminary result)   Collection Time: 09/09/20  6:23 AM  Result Value Ref Range   ABO/RH(D) PENDING    General alert and oriented Lung CTAB Car RRR Abdomen soft and non tender  Pelvic ultrasound - thickened endometrium  IMPRESSION: PMP bleeding  PLAN: TAH and BSO Frozen section Risks reviewed  Consent signed

## 2020-09-09 NOTE — Anesthesia Postprocedure Evaluation (Signed)
Anesthesia Post Note  Patient: Krystal Hill  Procedure(s) Performed: TOTAL ABDOMINAL HYSTERECTOMY WITH BILATERAL SALPINGO-OOPHORECTOMY WITH FROZEN SECTION (Bilateral )     Patient location during evaluation: PACU Anesthesia Type: General Level of consciousness: awake and alert Pain management: pain level controlled Vital Signs Assessment: post-procedure vital signs reviewed and stable Respiratory status: spontaneous breathing, nonlabored ventilation, respiratory function stable and patient connected to nasal cannula oxygen Cardiovascular status: blood pressure returned to baseline and stable Postop Assessment: no apparent nausea or vomiting Anesthetic complications: no   No complications documented.  Last Vitals:  Vitals:   09/09/20 1155 09/09/20 1258  BP: (!) 151/68 135/68  Pulse: (!) 101 97  Resp: 18 17  Temp: 36.7 C 36.6 C  SpO2: 97% 97%    Last Pain:  Vitals:   09/09/20 1258  TempSrc: Oral  PainSc:                  Barnet [place]

## 2020-09-09 NOTE — Addendum Note (Signed)
Addendum  created 09/09/20 1348 by Eben Burow, CRNA   Clinical Note Signed, Intraprocedure Blocks edited, Intraprocedure Event edited

## 2020-09-09 NOTE — Anesthesia Procedure Notes (Addendum)
Procedure Name: Intubation Date/Time: 09/09/2020 7:31 AM Performed by: Eben Burow, CRNA Pre-anesthesia Checklist: Patient identified, Emergency Drugs available, Suction available, Patient being monitored and Timeout performed Patient Re-evaluated:Patient Re-evaluated prior to induction Oxygen Delivery Method: Circle system utilized Preoxygenation: Pre-oxygenation with 100% oxygen Induction Type: IV induction Ventilation: Mask ventilation without difficulty Laryngoscope Size: Mac and 4 Grade View: Grade II Tube size: 7.0 mm Number of attempts: 1 Airway Equipment and Method: Stylet Placement Confirmation: ETT inserted through vocal cords under direct vision,  positive ETCO2 and breath sounds checked- equal and bilateral Secured at: 21 cm Tube secured with: Tape Dental Injury: Teeth and Oropharynx as per pre-operative assessment  Comments: SRNA Bernardo Heater performed airway.

## 2020-09-09 NOTE — Transfer of Care (Signed)
Immediate Anesthesia Transfer of Care Note  Patient: Krystal Hill  Procedure(s) Performed: TOTAL ABDOMINAL HYSTERECTOMY WITH BILATERAL SALPINGO-OOPHORECTOMY WITH FROZEN SECTION (Bilateral )  Patient Location: PACU  Anesthesia Type:General  Level of Consciousness: awake, drowsy and patient cooperative  Airway & Oxygen Therapy: Patient Spontanous Breathing and Patient connected to face mask oxygen  Post-op Assessment: Report given to RN and Post -op Vital signs reviewed and stable  Post vital signs: Reviewed and stable  Last Vitals:  Vitals Value Taken Time  BP    Temp    Pulse 99 09/09/20 0940  Resp 25 09/09/20 0940  SpO2 96 % 09/09/20 0940  Vitals shown include unvalidated device data.  Last Pain:  Vitals:   09/09/20 0624  TempSrc: Oral         Complications: No complications documented.

## 2020-09-10 ENCOUNTER — Encounter (HOSPITAL_COMMUNITY): Payer: Self-pay | Admitting: Obstetrics and Gynecology

## 2020-09-10 ENCOUNTER — Telehealth: Payer: Self-pay | Admitting: *Deleted

## 2020-09-10 LAB — BASIC METABOLIC PANEL
Anion gap: 9 (ref 5–15)
BUN: 11 mg/dL (ref 8–23)
CO2: 24 mmol/L (ref 22–32)
Calcium: 8.3 mg/dL — ABNORMAL LOW (ref 8.9–10.3)
Chloride: 108 mmol/L (ref 98–111)
Creatinine, Ser: 0.73 mg/dL (ref 0.44–1.00)
GFR, Estimated: >60 mL/min (ref >60)
Glucose, Bld: 187 mg/dL — ABNORMAL HIGH (ref 70–99)
Potassium: 5.1 mmol/L (ref 3.5–5.1)
Sodium: 141 mmol/L (ref 135–145)

## 2020-09-10 LAB — CBC
HCT: 28.2 % — ABNORMAL LOW (ref 36.0–46.0)
Hemoglobin: 8.4 g/dL — ABNORMAL LOW (ref 12.0–15.0)
MCH: 25.1 pg — ABNORMAL LOW (ref 26.0–34.0)
MCHC: 29.8 g/dL — ABNORMAL LOW (ref 30.0–36.0)
MCV: 84.4 fL (ref 80.0–100.0)
Platelets: 232 10*3/uL (ref 150–400)
RBC: 3.34 MIL/uL — ABNORMAL LOW (ref 3.87–5.11)
RDW: 14.4 % (ref 11.5–15.5)
WBC: 11.3 10*3/uL — ABNORMAL HIGH (ref 4.0–10.5)
nRBC: 0 % (ref 0.0–0.2)

## 2020-09-10 NOTE — Op Note (Signed)
Krystal Hill, Krystal Hill MEDICAL RECORD NO: [id number] ACCOUNT NO: [id number] DATE OF BIRTH: July 29, 1945 FACILITY: [facility] LOCATION: [id number] PHYSICIAN: Michelle L. Helane Rima, MD  Operative Report   DATE OF PROCEDURE: 09/09/2020  PREOPERATIVE DIAGNOSIS:  Postmenopausal bleeding.  POSTOPERATIVE DIAGNOSIS:  Postmenopausal bleeding.  FIGO grade I endometrial carcinoma.  SURGEON:  Michelle L. Helane Rima, MD  ASSISTANT:  Dr. Mardelle Matte  LOCATION:  [facility]  TYPE OF ANESTHESIA:  General.  ESTIMATED BLOOD LOSS:  462 mL  COMPLICATIONS:  None.  DRAINS:  Foley catheter.  DESCRIPTION OF PROCEDURE:  The patient was taken to the operating room.  She was prepped and draped after she was intubated and a Foley catheter was inserted a timeout was performed in the usual fashion.  A low transverse incision was made, carried down  to the fascia.  Fascia was scored in the midline and entered and the fascial incision was then extended laterally.  The peritoneum was entered bluntly and then the peritoneal incision was extended.  Exam of the pelvis revealed that the uterus was  enlarged and appeared to be myomatous.  There were no masses on the ovaries.  No adhesions.  No evidence of endometriosis.  A self-retaining retractor were placed in the abdominal cavity.  The patient was gently placed in mild Trendelenburg position.  We  then elevated the uterus, the uterus itself was very broad, so it was a little difficult to see the sidewalls initially as well as anteriorly because of the shape of her uterus, but we did elevate the triple pedicles by grasping them with Kelly clamps.   We first decided to remove the uterus.  Then, when we have better visualization after removal of the uterus, we would go back and perform the BSO.  At this point, I went ahead and clamped the triple pedicle on the right, thus the pedicle was clamped,  cut and suture ligated using 0 Vicryl suture and a double tie was used as well.  This was  performed on the left as well.  We then used clamps on the fundus and then we were able to elevate the uterus gradually more and more as we walked down the broad  ligament, clamping each pedicle and cutting it and then suture ligated using 0 Vicryl suture.  Once we had gone about halfway down the fundus we were able to see the bladder flap, at that point and then we were able to create the bladder flap with sharp  and blunt dissection.  We then clamped at the level of the internal os, uterine artery, each pedicle was clamped, cut and suture ligated using 0 Vicryl suture.  We then amputated the uterus because that would be the only way we would be able to safely  visualize the cervix for removal and the fundus was sent for frozen section.  While that was being analyzed then we proceeded with removal of the cervix by carefully dissecting the bladder down with sharp and blunt dissection and clamping along the  uterosacral ligaments on either side using a straight Heaney clamps.  Each pedicle was clamped using 0 Vicryl suture.  At this point, the cervix was a very long that we kept doing that until we reached the level of the external os, making sure that we  were well away from her bladder.  We then entered the vagina and removed the rest of the cervix and sent that for final pathology.  We then closed the vaginal cuff in series of interrupted using  0 Vicryl suture.  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  We decided to proceed with the BSO by grasping the first right tube and ovary and placing a curved Heaney clamp just  beneath that.  The pedicle was clamped, cut and suture ligated using an 0 Vicryl suture and further secured with a second free tie.  This was done in identical fashion on the left side.  At this point, irrigation was performed.  We carefully inspected  all  pedicles, everything was hemostatic.  We removed all instruments and laparotomy pads from the abdominal cavity.  We closed the peritoneum and 0 Vicryl suture.  We closed the fascia using 2 sutures of 0 Vicryl in a running stitch starting in the  midline.  After irrigation of subcutaneous layer and noting hemostasis, we closed that in a running plain gut suture.  The skin was closed with 3-0 Vicryl on a Keith needle.  All sponge, lap and instrument counts were correct x2.  At the end of the  procedure Benzoin, Steri-Strips, and a honeycomb dressing were applied.  The patient was extubated and went to recovery room in stable condition.   PUS D: 09/10/2020 7:28:18 am T: 09/10/2020 11:51:00 am  JOB: [id number]/ [id number]

## 2020-09-10 NOTE — Progress Notes (Signed)
POD # 1  Doing well. Ate eggs and toast for breakfast .   Pain under control.  BP 136/60 (BP Location: Left Arm)   Pulse 100   Temp 98 F (36.7 C) (Oral)   Resp 17   Ht 5\' 2"  (1.575 m)   Wt 116.6 kg   SpO2 98%   BMI 47.02 kg/m  Results for orders placed or performed during the hospital encounter of 09/09/20 (from the past 24 hour(s))  Basic metabolic panel     Status: Abnormal   Collection Time: 09/10/20  4:36 AM  Result Value Ref Range   Sodium 141 135 - 145 mmol/L   Potassium 5.1 3.5 - 5.1 mmol/L   Chloride 108 98 - 111 mmol/L   CO2 24 22 - 32 mmol/L   Glucose, Bld 187 (H) 70 - 99 mg/dL   BUN 11 8 - 23 mg/dL   Creatinine, Ser 0.73 0.44 - 1.00 mg/dL   Calcium 8.3 (L) 8.9 - 10.3 mg/dL   GFR, Estimated >60 >60 mL/min   Anion gap 9 5 - 15  CBC     Status: Abnormal   Collection Time: 09/10/20  4:36 AM  Result Value Ref Range   WBC 11.3 (H) 4.0 - 10.5 K/uL   RBC 3.34 (L) 3.87 - 5.11 MIL/uL   Hemoglobin 8.4 (L) 12.0 - 15.0 g/dL   HCT 28.2 (L) 36.0 - 46.0 %   MCV 84.4 80.0 - 100.0 fL   MCH 25.1 (L) 26.0 - 34.0 pg   MCHC 29.8 (L) 30.0 - 36.0 g/dL   RDW 14.4 11.5 - 15.5 %   Platelets 232 150 - 400 K/uL   nRBC 0.0 0.0 - 0.2 %   Urine output normal and clear  Abdomen soft and non tender Bandage clean and dry   POD # 1  Doing well Ambulate Advance diet Anticipate discharge home tomorrow

## 2020-09-10 NOTE — Telephone Encounter (Signed)
Called the patient and left a message to call the office back. Patient needs to be scheduled for a new patient appt in Dr Berline Lopes on 3/7

## 2020-09-11 ENCOUNTER — Telehealth: Payer: Self-pay | Admitting: *Deleted

## 2020-09-11 NOTE — Discharge Summary (Signed)
Admission Diagnosis: PMP Bleeding  Discharge Diagnosis: FIGO Grade 1 Endometrial Carcinoma  Hospital Course: 75 year old female admitted with heavy PMP Bleeding. Underwent TAH, BSO and Frozen Section revealed FIGO Grade 1 Endometrial Carcinoma.  Patient did very well post op. By POD # 2 she was ambulating a lot, passing gas, tolerating regular diet and was not on any pain medications.  BP (!) 129/51 (BP Location: Left Arm)   Pulse (!) 110   Temp 98 F (36.7 C) (Oral)   Resp 18   Ht 5\' 2"  (1.575 m)   Wt 116.6 kg   SpO2 98%   BMI 47.02 kg/m  No results found for this or any previous visit (from the past 24 hour(s)). Abdomen soft and non tender  Incision clean and dry and intact  Dressing dry   Patient discharged home in good condition Given strict post op precautions Follow up with me in 1 week Appt Dr Artist Pais March 7

## 2020-09-11 NOTE — Telephone Encounter (Signed)
Patient called and scheduled for a new patient appt on 3/7 with Dr Berline Lopes at 10:30 am with a 10 am arrival. Patient given the address and phone number for the clinic. Patient also given the policy for mask and visitors

## 2020-09-18 ENCOUNTER — Encounter: Payer: Self-pay | Admitting: Gynecologic Oncology

## 2020-09-21 ENCOUNTER — Other Ambulatory Visit: Payer: Self-pay

## 2020-09-21 ENCOUNTER — Inpatient Hospital Stay: Payer: Medicare PPO | Attending: Gynecologic Oncology | Admitting: Gynecologic Oncology

## 2020-09-21 ENCOUNTER — Encounter: Payer: Self-pay | Admitting: Gynecologic Oncology

## 2020-09-21 VITALS — BP 151/79 | HR 97 | Temp 97.9°F | Resp 22

## 2020-09-21 DIAGNOSIS — C541 Malignant neoplasm of endometrium: Secondary | ICD-10-CM | POA: Diagnosis present

## 2020-09-21 DIAGNOSIS — M1990 Unspecified osteoarthritis, unspecified site: Secondary | ICD-10-CM | POA: Insufficient documentation

## 2020-09-21 DIAGNOSIS — Z90722 Acquired absence of ovaries, bilateral: Secondary | ICD-10-CM | POA: Insufficient documentation

## 2020-09-21 DIAGNOSIS — Z90710 Acquired absence of both cervix and uterus: Secondary | ICD-10-CM | POA: Insufficient documentation

## 2020-09-21 NOTE — Progress Notes (Addendum)
GYNECOLOGIC ONCOLOGY NEW PATIENT CONSULTATION   Patient Name: Krystal Hill  Patient Age: 75 y.o. 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  Patient is overall doing well from a surgical standpoint.  We discussed continued expectations as well as restrictions postoperatively.  In terms of her pathology, I reviewed in detail with the patient as well as her cousin the findings on final pathology from her surgery.  Overall, she has low risk disease and does not have historically described risk factors for either recurrence or lymph node involvement.  In the setting of inner half myometrial involvement and grade 1 disease, her risk of lymph node involvement is less than 5% per GOG 33.  Additionally, she has no cervical stromal involvement or LVSI.  When thinking about her risk factors the only finding on final pathology that would increase her risk of possible lymph node involvement is the size of her tumor itself.  Overall, I believe her risk of lymph node involvement is less than 5%.  In this setting, I think that the risks outweigh the benefits in terms of repeat surgery for lymph node assessment, which would involve bilateral pelvic lymphadenectomy.  We discussed that one tool to evaluate the lymph nodes is CT scan.  Given her overall low risk, I do not think that CT scan is warranted; however, I will plan to present her case at tumor board for multidisciplinary discussion.  My recommendation would be for close follow-up.  We discussed the natural history of endometrial cancer and its close relationship to excess estrogen.  In terms of risk reduction moving forward as well as her risk for  disease to begin with, we discussed the role that her obesity played in the pathogenesis of her cancer.  She has lost 6 pounds since surgery and I congratulated her on this weight loss.  I stressed the importance of continued weight loss both for her overall health but also her cancer survival.  Patient has not had a primary care provider in a number of years and I suspect is lacking certain medical diagnoses, such as hypertension and hyperlipidemia, simply because she has not sought regular medical care.  She is planning to continue her cancer care here in [place] and I offered to put in a referral for her to establish with a primary care provider.  She was amenable to this.  Given that her family is in [place], we will plan to look for a primary care option closer to her.  Patient is planning to stay in town for an additional 6 weeks.  I recommended that we schedule her back for a visit in approximately 1 month for survivorship care.  We will provide her with a survivorship care plan as well as discussed future tools and recommendations for overall health and weight loss.  We discussed NCCN recommendations for surveillance follow-up.  We will plan to see patient every 6 months and can alternate between my office and her gynecologist, who she would like to continue seeing.  We discussed signs and symptoms that would be concerning for disease recurrence.  A copy of this note was sent to the patient's referring provider.   75 minutes of total time was spent for this patient encounter, including preparation, face-to-face  counseling with the patient and coordination of care, and documentation of the encounter.   Jeral Pinch, MD  Division of Gynecologic Oncology  Department of Obstetrics and Gynecology  [facility]  ___________________________________________  Chief Complaint: Chief Complaint  Patient presents with  . Endometrial carcinoma (HCC)     History of Present Illness:  Krystal Hill is a 75 y.o. y.o. female who is seen in consultation at the request of Dian Queen, MD for an evaluation of newly diagnosed uterine cancer.  Patient endorses intermittent and infrequent vaginal bleeding for a couple of years.  She describes this as spotting for 2 or 3 days and then not having any bleeding for multiple months.  While on a trip to the [place] with her cousin and family, the patient had an episode of very heavy vaginal bleeding and called Dr. Christen Butter office for an appointment to be seen.  She subsequently underwent a D&C in October with pathology revealing simple hyperplasia and an endometrial polyp, scant inactive endometrium.  After her procedure, her bleeding eventually worsened and she describes beginning to have passage of blood clots.  She had another episode of heavy bleeding where she bled through her clothing and went back to be seen by her GYN.  Definitive surgery was recommended at that time.  On 2/23, patient underwent total abdominal hysterectomy and BSO with no evidence of intra-abdominal or pelvic disease.  Final pathology confirmed intraoperative frozen section of grade 1 endometrioid adenocarcinoma.  Since surgery, patient reports she has been doing well.  She was seen for follow-up last Wednesday.  She reports normal bowel and bladder function.  She denies any vaginal bleeding or discharge since surgery.  She is tolerating p.o. without nausea or emesis.  She denies any significant pelvic or abdominal pain.  She denies fevers or chills.  She has some fatigue that is slowly improving since surgery.  Patient describes herself as being overall healthy but has not had a primary care provider in about 40 years.  She gained about 30 pounds in the last year.  She splits her time between here in [place] and is retired.  In [place], she lives with her sister.  Treatment History: Oncology History  Endometrial carcinoma ([place])   05/07/2020 Surgery   D&C, endocervical polypectomy   05/07/2020 Pathology Results   A. ENDOCERVIX, POLYPECTOMY:  - Benign endocervical polyp with microglandular hyperplasia and squamous  metaplasia.  - No dysplasia or malignancy.   B. ENDOMETRIUM, CURETTAGE:  - Endometrial polyp with simple hyperplasia without atypia.  - Scanty inactive endometrium.  - No atypia or malignancy.    09/09/2020 Initial Diagnosis   Endometrial carcinoma ([place])   09/09/2020 Surgery   TAH/BSO  Dr. Dian Queen     09/09/2020 Pathology Results   A-D. UTERUS, CERVIX AND BILATERAL FALLOPIAN TUBES AND OVARIES, TOTAL  HYSTERECTOMY AND BILATERAL SALPINGO-OOPHORECTOMY:  - Endometrioid carcinoma, FIGO grade 1, with invasion less than half the  myometrium.  - No involvement of uterine serosa, cervical stroma or adnexa.  - See oncology table.   ONCOLOGY TABLE:   UTERUS, CARCINOMA OR CARCINOSARCOMA: Resection   Procedure: Total hysterectomy and bilateral salpingo-oophorectomy  Histologic Type: Endometrioid carcinoma  Histologic Grade: FIGO grade 1  Myometrial Invasion: < 50%  Uterine Serosa Involvement: Not identified  Cervical Stroma Involvement: Not identified  Other Tissue/Organ Involvement: Not identified  Peritoneal/Ascitic Fluid: Not submitted/unknown  Lymphovascular Invasion: Not identified  Regional Lymph Nodes: Not applicable (no regional lymph nodes submitted  or found)  Distant Metastasis:       Distant Site(s) Involved: Not applicable  Pathologic Stage Classification (pTNM, AJCC 8th Edition): pT1a, pN not  assigned  Additional findings: Adenomyosis.  Leiomyoma.  Mucinous cystadenofibroma  of right ovary.  Ancillary Studies: MMR/MSI testing will be ordered  Representative Tumor Block: A14  Comment: Adenocarcinoma involves adenomyosis and overall invades less  than half the myometrium.  Intradepartmental consultation (Dr. Tresa Moore).  (v4.2.0.1)      PAST MEDICAL HISTORY:  Past  Medical History:  Diagnosis Date  . Arthritis   . Dyspnea    with exertion   . Endometrial cancer ([place])   . Obesity      PAST SURGICAL HISTORY:  Past Surgical History:  Procedure Laterality Date  . BREAST SURGERY    . CHOLECYSTECTOMY    . DILATION AND CURETTAGE OF UTERUS    . HYSTERECTOMY ABDOMINAL WITH SALPINGO-OOPHORECTOMY Bilateral 09/09/2020   Procedure: TOTAL ABDOMINAL HYSTERECTOMY WITH BILATERAL SALPINGO-OOPHORECTOMY WITH FROZEN SECTION;  Surgeon: Dian Queen, MD;  Location: [facility];  Service: Gynecology;  Laterality: Bilateral;  . HYSTEROSCOPY WITH D & C N/A 05/07/2020   Procedure: DILATATION AND CURETTAGE;  Surgeon: Dian Queen, MD;  Location: [facility];  Service: Gynecology;  Laterality: N/A;    OB/GYN HISTORY:  OB History  Gravida Para Term Preterm AB Living  0 0 0 0 0 0  SAB IAB Ectopic Multiple Live Births  0 0 0 0 0    No LMP recorded. Patient is postmenopausal.  Age at menarche: 12 Age at menopause: 38 Hx of HRT: Denies Hx of STDs: Denies Last pap: Has never had, has not been sexually active with a man History of abnormal pap smears: Denies  SCREENING STUDIES:  Last mammogram: 2021  Last colonoscopy: Has never had  MEDICATIONS: Outpatient Encounter Medications as of 09/21/2020  Medication Sig  . [DISCONTINUED] ibuprofen (ADVIL) 100 MG/5ML suspension Take 200 mg by mouth every 4 (four) hours as needed. (Patient not taking: Reported on 09/04/2020)  . [DISCONTINUED] megestrol (MEGACE) 40 MG tablet Take 40 mg by mouth 2 (two) times daily.  . [DISCONTINUED] naproxen sodium (ALEVE) 220 MG tablet Take 220 mg by mouth. (Patient not taking: Reported on 09/04/2020)   No facility-administered encounter medications on file as of 09/21/2020.    ALLERGIES:  No Known Allergies   FAMILY HISTORY:  Family History  Problem Relation Age of Onset  . Breast cancer Sister   . Breast cancer Niece   . Breast cancer Niece   . Colon cancer Neg Hx    . Ovarian cancer Neg Hx   . Prostate cancer Neg Hx   . Pancreatic cancer Neg Hx   . Endometrial cancer Neg Hx      SOCIAL HISTORY:  Social Connections: Not on file    REVIEW OF SYSTEMS:  Denies appetite changes, fevers, chills, fatigue, unexplained weight changes. Denies hearing loss, neck lumps or masses, mouth sores, ringing in ears or voice changes. Denies cough or wheezing.  Denies shortness of breath. Denies chest pain or palpitations. Denies leg swelling. Denies abdominal distention, pain, blood in stools, constipation, diarrhea, nausea, vomiting, or early satiety. Denies pain with intercourse, dysuria, frequency, hematuria or incontinence. Denies hot flashes, pelvic pain, vaginal bleeding or vaginal discharge.   Denies joint pain, back pain or muscle pain/cramps. Denies itching, rash, or wounds. Denies dizziness, headaches, numbness or seizures. Denies swollen lymph nodes or glands, denies easy bruising or bleeding. Denies anxiety, depression, confusion, or decreased concentration.  Physical  Exam:  Vital Signs for this encounter:  Blood pressure (!) 151/79, pulse 97, temperature 97.9 F (36.6 C), temperature source Tympanic, resp. rate (!) 22, SpO2 99 %. There is no height or weight on file to calculate BMI. General: Alert, oriented, no acute distress.  HEENT: Normocephalic, atraumatic. Sclera anicteric.  Chest: Clear to auscultation bilaterally. No wheezes, rhonchi, or rales. Cardiovascular: Regular rate and rhythm, no murmurs, rubs, or gallops.  Abdomen: Obese. Normoactive bowel sounds. Soft, nondistended, nontender to palpation. No masses or hepatosplenomegaly appreciated. No palpable fluid wave.  Financed DO incision is without erythema, induration, or exudate.  There are multiple areas along the incision, all less than 1 cm, where the skin has opened superficially. Extremities: Grossly normal range of motion. Warm, well perfused. No edema bilaterally.  Skin: No rashes  or lesions.  Lymphatics: No cervical, supraclavicular, or inguinal adenopathy.  GU:  Normal external female genitalia.  No lesions. No discharge or bleeding.             Bladder/urethra:  No lesions or masses, well supported bladder             Vagina: Mildly atrophic, no lesions or masses.  No blood or discharge within the vault.             Cervix: Surgically absent.  Cuff is intact with suture visible.             Uterus: Surgically absent.             Adnexa: No masses appreciated.  Rectal: No nodularity.  LABORATORY AND RADIOLOGIC DATA:  Outside medical records were reviewed to synthesize the above history, along with the history and physical obtained during the visit.   Lab Results  Component Value Date   WBC 11.3 (H) 09/10/2020   HGB 8.4 (L) 09/10/2020   HCT 28.2 (L) 09/10/2020   PLT 232 09/10/2020   GLUCOSE 187 (H) 09/10/2020   NA 141 09/10/2020   K 5.1 09/10/2020   CL 108 09/10/2020   CREATININE 0.73 09/10/2020   BUN 11 09/10/2020   CO2 24 09/10/2020

## 2020-09-21 NOTE — Patient Instructions (Signed)
It was a pleasure meeting you. We will see you back in a month for a survivorship care visit. We will go over the details of your diagnosis again, recommended follow-up and other screening that you should have.  I placed a referral for you to establish a primary care provider.

## 2020-09-22 ENCOUNTER — Telehealth: Payer: Self-pay

## 2020-09-22 ENCOUNTER — Encounter (HOSPITAL_COMMUNITY): Payer: Self-pay

## 2020-09-22 NOTE — Telephone Encounter (Signed)
Gave Krystal Hill the appointment with Dr. Luetta Nutting  for establishing PCP. Appointment is 10-08-20 at 1050.  They are on [address]. Phone [phone]. Pt verbalized understanding.

## 2020-09-23 LAB — SURGICAL PATHOLOGY

## 2020-09-28 ENCOUNTER — Other Ambulatory Visit: Payer: Self-pay | Admitting: Oncology

## 2020-09-28 ENCOUNTER — Other Ambulatory Visit: Payer: Self-pay | Admitting: Gynecologic Oncology

## 2020-09-28 ENCOUNTER — Telehealth: Payer: Self-pay | Admitting: Oncology

## 2020-09-28 DIAGNOSIS — C541 Malignant neoplasm of endometrium: Secondary | ICD-10-CM

## 2020-09-28 NOTE — Telephone Encounter (Signed)
Called Krystal Hill and advised her of the GYN Cancer Conference's recommendation for a PET scan to evaluate for metastatic disease. Apt scheduled for 10/08/20 at 9:00.  Krystal Hill asked that it be rescheduled to 10/09/20 since she already has an appointment on 10/08/20.  She will watch for the new appointment in [place].

## 2020-09-28 NOTE — Progress Notes (Signed)
Gynecologic Oncology Multi-Disciplinary Disposition Conference Note  Date of the Conference: 09/28/2020  Patient Name: Krystal Hill  Referring Provider: Dr. Helane Rima Primary GYN Oncologist: Dr. Berline Lopes  Stage/Disposition:  Grade 1 endometrioid endometrial adenocarcinoma. Disposition is for a PET scan to evaluate for metastatic disease.  This Multidisciplinary conference took place involving physicians from [facility], [facility], Radiation Oncology, Pathology, Radiology along with the Gynecologic Oncology Nurse Practitioner and RN.  Comprehensive assessment of the patient's malignancy, staging, need for surgery, chemotherapy, radiation therapy, and need for further testing were reviewed. Supportive measures, both inpatient and following discharge were also discussed. The recommended plan of care is documented. Greater than 35 minutes were spent correlating and coordinating this patient's care.

## 2020-10-08 ENCOUNTER — Other Ambulatory Visit: Payer: Self-pay

## 2020-10-08 ENCOUNTER — Encounter: Payer: Self-pay | Admitting: Family Medicine

## 2020-10-08 ENCOUNTER — Other Ambulatory Visit: Payer: Self-pay | Admitting: Family Medicine

## 2020-10-08 ENCOUNTER — Ambulatory Visit (HOSPITAL_COMMUNITY): Payer: Medicare PPO

## 2020-10-08 ENCOUNTER — Ambulatory Visit (INDEPENDENT_AMBULATORY_CARE_PROVIDER_SITE_OTHER): Payer: Medicare PPO | Admitting: Family Medicine

## 2020-10-08 VITALS — BP 131/78 | HR 86 | Temp 97.7°F | Ht 61.42 in | Wt 253.0 lb

## 2020-10-08 DIAGNOSIS — C541 Malignant neoplasm of endometrium: Secondary | ICD-10-CM | POA: Diagnosis not present

## 2020-10-08 DIAGNOSIS — E119 Type 2 diabetes mellitus without complications: Secondary | ICD-10-CM | POA: Diagnosis not present

## 2020-10-08 DIAGNOSIS — Z13220 Encounter for screening for lipoid disorders: Secondary | ICD-10-CM | POA: Diagnosis not present

## 2020-10-08 DIAGNOSIS — Z1211 Encounter for screening for malignant neoplasm of colon: Secondary | ICD-10-CM | POA: Diagnosis not present

## 2020-10-08 NOTE — Patient Instructions (Signed)
Great to meet you today! Please have labs completed.  I will plan to see you again in about 3-4 months.

## 2020-10-08 NOTE — Assessment & Plan Note (Signed)
Management per Gyn/Onc.  Has upcoming PET scan.

## 2020-10-08 NOTE — Assessment & Plan Note (Signed)
Elevated blood glucose previously.  Update a1c.

## 2020-10-08 NOTE — Assessment & Plan Note (Signed)
Lipid panel ordered.

## 2020-10-08 NOTE — Progress Notes (Signed)
Krystal Hill - 75 y.o. female MRN [id number]  Date of birth: 26-Oct-1945  Subjective Chief Complaint  Patient presents with  . Establish Care    HPI Krystal Hill is a 75 y.o. female here today for initial visit to establish care.  She splits time between [facility] and [place].  She has not had a PCP in nearly 40 years.  She recently had TSH with BSO for endometrial carcinoma with Dr. Helane Rima.  She followed up with Dr. Berline Lopes in GYN/Onc who referred her to our practice.  Concern for other underlying medical conditions. Her BP has been elevated on occasion and her blood sugar was noted to be elevated on previous labs.    She has never had colon cancer screening.  She does not want to have a colonoscopy but is willing to have cologuard.  She is set up for DEXA and has had mammogram.   ROS:  A comprehensive ROS was completed and negative except as noted per HPI  No Known Allergies  Past Medical History:  Diagnosis Date  . Arthritis   . Dyspnea    with exertion   . Endometrial cancer ([place])   . Obesity     Past Surgical History:  Procedure Laterality Date  . BREAST SURGERY    . CHOLECYSTECTOMY    . DILATION AND CURETTAGE OF UTERUS    . HYSTERECTOMY ABDOMINAL WITH SALPINGO-OOPHORECTOMY Bilateral 09/09/2020   Procedure: TOTAL ABDOMINAL HYSTERECTOMY WITH BILATERAL SALPINGO-OOPHORECTOMY WITH FROZEN SECTION;  Surgeon: Dian Queen, MD;  Location: [facility];  Service: Gynecology;  Laterality: Bilateral;  . HYSTEROSCOPY WITH D & C N/A 05/07/2020   Procedure: DILATATION AND CURETTAGE;  Surgeon: Dian Queen, MD;  Location: [facility];  Service: Gynecology;  Laterality: N/A;    Social History   Socioeconomic History  . Marital status: Single    Spouse name: Not on file  . Number of children: 1  . Years of education: 44  . Highest education level: Not on file  Occupational History  . Occupation: Retired  Tobacco Use  . Smoking status: Never Smoker  . Smokeless tobacco:  Never Used  Vaping Use  . Vaping Use: Never used  Substance and Sexual Activity  . Alcohol use: Never  . Drug use: Never  . Sexual activity: Not Currently    Birth control/protection: Post-menopausal  Other Topics Concern  . Not on file  Social History Narrative  . Not on file   Social Determinants of Health   Financial Resource Strain: Not on file  Food Insecurity: Not on file  Transportation Needs: Not on file  Physical Activity: Not on file  Stress: Not on file  Social Connections: Not on file    Family History  Problem Relation Age of Onset  . Breast cancer Sister   . Breast cancer Niece   . Breast cancer Niece   . Colon cancer Neg Hx   . Ovarian cancer Neg Hx   . Prostate cancer Neg Hx   . Pancreatic cancer Neg Hx   . Endometrial cancer Neg Hx     Health Maintenance  Topic Date Due  . HEMOGLOBIN A1C  Never done  . Hepatitis C Screening  Never done  . FOOT EXAM  Never done  . OPHTHALMOLOGY EXAM  Never done  . URINE MICROALBUMIN  Never done  . COLONOSCOPY (Pts 45-46yrs Insurance coverage will need to be confirmed)  Never done  . MAMMOGRAM  Never done  . DEXA SCAN  Never done  .  INFLUENZA VACCINE  03/18/2021 (Originally 02/16/2020)  . PNA vac Low Risk Adult (1 of 2 - PCV13) 03/18/2021 (Originally 01/10/2013)  . TETANUS/TDAP  10/08/2021 (Originally 01/11/1967)  . COVID-19 Vaccine (4 - Booster for Pfizer series) 01/06/2021  . HPV VACCINES  Aged Out     ----------------------------------------------------------------------------------------------------------------------------------------------------------------------------------------------------------------- Physical Exam BP 131/78 (BP Location: Left Arm, Patient Position: Sitting, Cuff Size: Large)   Pulse 86   Temp 97.7 F (36.5 C)   Ht 5' 1.42" (1.56 m)   Wt 253 lb (114.8 kg)   SpO2 95%   BMI 47.16 kg/m   Physical Exam Constitutional:      Appearance: Normal appearance.  HENT:     Head:  Normocephalic and atraumatic.  Eyes:     General: No scleral icterus. Cardiovascular:     Rate and Rhythm: Normal rate and regular rhythm.  Pulmonary:     Effort: Pulmonary effort is normal.     Breath sounds: Normal breath sounds.  Musculoskeletal:     Cervical back: Neck supple.  Neurological:     General: No focal deficit present.     Mental Status: She is alert.  Psychiatric:        Mood and Affect: Mood normal.        Behavior: Behavior normal.     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  Update a1c.  Endometrial carcinoma ([place]) Management per Gyn/Onc.  Has upcoming PET scan.   Colon cancer screening Cologuard ordered.   Screening for lipid disorders Lipid panel ordered.    No orders of the defined types were placed in this encounter.   No follow-ups on file.    This visit occurred during the SARS-CoV-2 public health emergency.  Safety protocols were in place, including screening questions prior to the visit, additional usage of staff PPE, and extensive cleaning of exam room while observing appropriate contact time as indicated for disinfecting solutions.

## 2020-10-08 NOTE — Assessment & Plan Note (Signed)
Cologuard ordered

## 2020-10-09 ENCOUNTER — Ambulatory Visit (HOSPITAL_COMMUNITY)
Admission: RE | Admit: 2020-10-09 | Discharge: 2020-10-09 | Disposition: A | Discharge status: Home / Self Care | Payer: Medicare PPO | Source: Ambulatory Visit | Attending: Gynecologic Oncology | Admitting: Gynecologic Oncology

## 2020-10-09 DIAGNOSIS — C541 Malignant neoplasm of endometrium: Secondary | ICD-10-CM | POA: Insufficient documentation

## 2020-10-09 LAB — GLUCOSE, CAPILLARY: Glucose-Capillary: 156 mg/dL — ABNORMAL HIGH (ref 70–99)

## 2020-10-09 MED ORDER — FLUDEOXYGLUCOSE F - 18 (FDG) INJECTION
13.1000 | Freq: Once | INTRAVENOUS | Status: AC
  Administered 2020-10-09: 12.7 via INTRAVENOUS

## 2020-10-10 LAB — LIPID PANEL W/REFLEX DIRECT LDL
Cholesterol: 132 mg/dL (ref <200)
HDL: 36 mg/dL — ABNORMAL LOW (ref >=50)
LDL Cholesterol (Calc): 73 mg/dL (calc)
Non-HDL Cholesterol (Calc): 96 mg/dL (calc) (ref <130)
Total CHOL/HDL Ratio: 3.7 (calc) (ref <5.0)
Triglycerides: 146 mg/dL (ref <150)

## 2020-10-10 LAB — HEMOGLOBIN A1C
Hgb A1c MFr Bld: 7.4 % of total Hgb — ABNORMAL HIGH (ref <5.7)
Mean Plasma Glucose: 166 mg/dL
eAG (mmol/L): 9.2 mmol/L

## 2020-10-10 LAB — COMPLETE METABOLIC PANEL WITH GFR
AG Ratio: 1.6 (calc) (ref 1.0–2.5)
ALT: 25 U/L (ref 6–29)
AST: 36 U/L — ABNORMAL HIGH (ref 10–35)
Albumin: 4 g/dL (ref 3.6–5.1)
Alkaline phosphatase (APISO): 85 U/L (ref 37–153)
BUN: 12 mg/dL (ref 7–25)
CO2: 25 mmol/L (ref 20–32)
Calcium: 8.7 mg/dL (ref 8.6–10.4)
Chloride: 104 mmol/L (ref 98–110)
Creat: 0.61 mg/dL (ref 0.60–0.93)
GFR, Est African American: 105 mL/min/{1.73_m2} (ref >=60)
GFR, Est Non African American: 91 mL/min/{1.73_m2} (ref >=60)
Globulin: 2.5 g/dL (calc) (ref 1.9–3.7)
Glucose, Bld: 134 mg/dL (ref 65–139)
Potassium: 4 mmol/L (ref 3.5–5.3)
Sodium: 139 mmol/L (ref 135–146)
Total Bilirubin: 0.3 mg/dL (ref 0.2–1.2)
Total Protein: 6.5 g/dL (ref 6.1–8.1)

## 2020-10-10 LAB — CBC
HCT: 30.1 % — ABNORMAL LOW (ref 35.0–45.0)
Hemoglobin: 9 g/dL — ABNORMAL LOW (ref 11.7–15.5)
MCH: 22.7 pg — ABNORMAL LOW (ref 27.0–33.0)
MCHC: 29.9 g/dL — ABNORMAL LOW (ref 32.0–36.0)
MCV: 76 fL — ABNORMAL LOW (ref 80.0–100.0)
MPV: 11.9 fL (ref 7.5–12.5)
Platelets: 269 10*3/uL (ref 140–400)
RBC: 3.96 10*6/uL (ref 3.80–5.10)
RDW: 15.1 % — ABNORMAL HIGH (ref 11.0–15.0)
WBC: 7.8 10*3/uL (ref 3.8–10.8)

## 2020-10-14 ENCOUNTER — Encounter: Payer: Self-pay | Admitting: Family Medicine

## 2020-10-14 ENCOUNTER — Other Ambulatory Visit: Payer: Self-pay | Admitting: Family Medicine

## 2020-10-14 MED ORDER — METFORMIN HCL ER 500 MG PO TB24: 1000.0000 mg | ORAL_TABLET | Freq: Every day | ORAL | 1 refills | Status: DC

## 2020-10-28 LAB — COLOGUARD: Cologuard: POSITIVE — AB

## 2020-10-30 NOTE — Patient Instructions (Addendum)
  Plan to follow up with Dr. Berline Lopes in September 2022 for continued follow up. Please call our office in June or July 2022 at [phone] to schedule an appointment for September 2022.   Today we reviewed your survivorship care plan. If you have any questions please call the office at the above number.  Congratulations on your weight loss so far! Weight loss can be challenging but taking small steps can help. Continue efforts to increase your physical activity.   Please give your primary care a call to see what adjustments or changes can be made in regards to your metformin if needed. You can use miralax daily and or stool softeners to help prevent constipation while taking the iron.

## 2020-11-02 ENCOUNTER — Encounter: Payer: Self-pay | Admitting: Family Medicine

## 2020-11-02 ENCOUNTER — Other Ambulatory Visit: Payer: Self-pay

## 2020-11-02 ENCOUNTER — Encounter: Payer: Self-pay | Admitting: Gynecologic Oncology

## 2020-11-02 ENCOUNTER — Inpatient Hospital Stay: Payer: Medicare PPO | Attending: Gynecologic Oncology | Admitting: Gynecologic Oncology

## 2020-11-02 ENCOUNTER — Other Ambulatory Visit: Payer: Self-pay | Admitting: Family Medicine

## 2020-11-02 VITALS — BP 149/77 | HR 102 | Temp 97.4°F | Resp 16 | Ht 62.0 in | Wt 252.0 lb

## 2020-11-02 DIAGNOSIS — C541 Malignant neoplasm of endometrium: Secondary | ICD-10-CM | POA: Insufficient documentation

## 2020-11-02 DIAGNOSIS — G2581 Restless legs syndrome: Secondary | ICD-10-CM | POA: Insufficient documentation

## 2020-11-02 DIAGNOSIS — Z90722 Acquired absence of ovaries, bilateral: Secondary | ICD-10-CM | POA: Diagnosis not present

## 2020-11-02 DIAGNOSIS — E119 Type 2 diabetes mellitus without complications: Secondary | ICD-10-CM | POA: Diagnosis not present

## 2020-11-02 DIAGNOSIS — Z90710 Acquired absence of both cervix and uterus: Secondary | ICD-10-CM | POA: Insufficient documentation

## 2020-11-02 DIAGNOSIS — Z7984 Long term (current) use of oral hypoglycemic drugs: Secondary | ICD-10-CM | POA: Diagnosis not present

## 2020-11-02 DIAGNOSIS — G4700 Insomnia, unspecified: Secondary | ICD-10-CM | POA: Diagnosis not present

## 2020-11-02 DIAGNOSIS — R195 Other fecal abnormalities: Secondary | ICD-10-CM

## 2020-11-02 NOTE — Progress Notes (Signed)
Gynecologic Oncology Survivorship Care Plan Visit:  Follow Up  REASON FOR VISIT:  Survivorship care plan review  BRIEF ONCOLOGIC HISTORY:  Oncology History  Endometrial carcinoma ([place])  05/07/2020 Surgery   D&C, endocervical polypectomy   05/07/2020 Pathology Results   A. ENDOCERVIX, POLYPECTOMY:  - Benign endocervical polyp with microglandular hyperplasia and squamous  metaplasia.  - No dysplasia or malignancy.   B. ENDOMETRIUM, CURETTAGE:  - Endometrial polyp with simple hyperplasia without atypia.  - Scanty inactive endometrium.  - No atypia or malignancy.    09/09/2020 Initial Diagnosis   Endometrial carcinoma ([place])   09/09/2020 Surgery   TAH/BSO  Dr. Dian Queen     09/09/2020 Pathology Results   A-D. UTERUS, CERVIX AND BILATERAL FALLOPIAN TUBES AND OVARIES, TOTAL  HYSTERECTOMY AND BILATERAL SALPINGO-OOPHORECTOMY:  - Endometrioid carcinoma, FIGO grade 1, with invasion less than half the  myometrium.  - No involvement of uterine serosa, cervical stroma or adnexa.  - See oncology table.   ONCOLOGY TABLE:   UTERUS, CARCINOMA OR CARCINOSARCOMA: Resection   Procedure: Total hysterectomy and bilateral salpingo-oophorectomy  Histologic Type: Endometrioid carcinoma  Histologic Grade: FIGO grade 1  Myometrial Invasion: < 50%  Uterine Serosa Involvement: Not identified  Cervical Stroma Involvement: Not identified  Other Tissue/Organ Involvement: Not identified  Peritoneal/Ascitic Fluid: Not submitted/unknown  Lymphovascular Invasion: Not identified  Regional Lymph Nodes: Not applicable (no regional lymph nodes submitted  or found)  Distant Metastasis:       Distant Site(s) Involved: Not applicable  Pathologic Stage Classification (pTNM, AJCC 8th Edition): pT1a, pN not  assigned  Additional findings: Adenomyosis.  Leiomyoma.  Mucinous cystadenofibroma  of right ovary.  Ancillary Studies: MMR/MSI testing will be ordered  Representative Tumor Block: A14   Comment: Adenocarcinoma involves adenomyosis and overall invades less  than half the myometrium.  Intradepartmental consultation (Dr. Tresa Moore).  (v4.2.0.1)      INTERVAL HISTORY:  Krystal Hill presents to the Survivorship Clinic today to review her survivorship care plan detailing her treatment course for endometrial cancer, as well as monitoring long-term side effects of that treatment, education regarding health maintenance, screening, and overall wellness and health promotion.     Overall, Krystal Hill reports feeling well since surgery with Dr. Helane Rima on 09/10/2020.  She reports no pain and states her incision is healing well with no signs of infection.  She denies having any vaginal bleeding.  She recently met with a new primary care physician, Dr. Luetta Nutting, and had lab work drawn at that visit.  Her hemoglobin A1c on October 08, 2020 resulted at 7.4.  A CBC drawn on the same day showed her hemoglobin level at 9.0 and hematocrit at 30.1.  This is up from her last values on September 10, 2020 with hemoglobin at 8.4 and hematocrit 28.2.  She states she was started on metformin 1000 mg XR once a day and iron supplementation.  She states the metformin makes her feel strange after taking it and she has been fatigued throughout the day.  She also reports constipation while taking the iron. She has stopped taking both of these medications on Friday, April 15 due to undesired side effects.    She is planning on reaching out to her primary care before she leaves to go to [place] to see what medication adjustments can be made.  She has tried MiraLAX for the constipation.  She is not checking her blood sugars and wonders if this is something her primary care will have her do  in the future.  She states she is increasing her physical activity and walking in her neighborhood but states she is fatigued when she gets back home.  She is trying to make better dietary choices and does not drink sugary drinks.  She states  she recently completed the Cologuard screening and received an email on Friday stating the results have been sent to her primary care but she is unaware of these results at this time.  She reports intermittent insomnia and states she has restless leg symptoms (throbbing) when she lays down at night. Discussed options for this but patient states she does not like to take medication. Reviewed signs and symptoms of peripheral neuropathy as well. She has a bone density scan with Dr. Christen Butter office in the near future.  REVIEW OF SYSTEMS:  Review of Systems - Oncology No fever, chills, early satiety, nausea, emesis, abdominal bloating. Negative for vaginal bleeding. Positive for constipation. Intermittent fatigue which she relates to taking metformin along with her intermittent insomnia. No lower extremity edema. Other review negative.  ONCOLOGY TREATMENT TEAM:  1. Dr. Jeral Pinch with GYN Oncology   PAST MEDICAL/SURGICAL HISTORY:  Past Medical History:  Diagnosis Date  . Arthritis   . Dyspnea    with exertion   . Endometrial cancer ([place])   . Obesity    Past Surgical History:  Procedure Laterality Date  . BREAST SURGERY    . CHOLECYSTECTOMY    . DILATION AND CURETTAGE OF UTERUS    . HYSTERECTOMY ABDOMINAL WITH SALPINGO-OOPHORECTOMY Bilateral 09/09/2020   Procedure: TOTAL ABDOMINAL HYSTERECTOMY WITH BILATERAL SALPINGO-OOPHORECTOMY WITH FROZEN SECTION;  Surgeon: Dian Queen, MD;  Location: [facility];  Service: Gynecology;  Laterality: Bilateral;  . HYSTEROSCOPY WITH D & C N/A 05/07/2020   Procedure: DILATATION AND CURETTAGE;  Surgeon: Dian Queen, MD;  Location: [facility];  Service: Gynecology;  Laterality: N/A;     ALLERGIES:  No Known Allergies   CURRENT MEDICATIONS:  Outpatient Encounter Medications as of 11/02/2020  Medication Sig Note  . Ferrous Sulfate (IRON PO) Take by mouth. (Patient not taking: Reported on 11/02/2020) 11/02/2020: Last dose 10-29-20 due  to constipation    . metFORMIN (GLUCOPHAGE-XR) 500 MG 24 hr tablet Take 2 tablets (1,000 mg total) by mouth daily with breakfast. (Patient not taking: Reported on 11/02/2020) 11/02/2020: Stopped 10-30-20 due to constipatation   No facility-administered encounter medications on file as of 11/02/2020.     ONCOLOGIC FAMILY HISTORY:  Family History  Problem Relation Age of Onset  . Breast cancer Sister   . Breast cancer Niece   . Breast cancer Niece   . Colon cancer Neg Hx   . Ovarian cancer Neg Hx   . Prostate cancer Neg Hx   . Pancreatic cancer Neg Hx   . Endometrial cancer Neg Hx     GENETIC COUNSELING/TESTING: None noted. MSI stable  SOCIAL HISTORY:  Krystal Hill lives in [place] but frequents [place] to see family. She plans on following up with her physicians in [place]. She denies any current or history of tobacco, alcohol, or illicit drug use.    PHYSICAL EXAMINATION:  Vital Signs:   Vitals:   11/02/20 1040  BP: (!) 149/77  Pulse: (!) 102  Resp: 16  Temp: (!) 97.4 F (36.3 C)  SpO2: 98%   Filed Weights   11/02/20 1040  Weight: 252 lb (114.3 kg)   General: Well-nourished, well-appearing female in no acute distress.  Accompanied by her relative. Exam deferred since  patient had recent examination including a pelvic exam with Dr. Berline Lopes on 09/21/2020. Recently seen by Dr. Helane Rima for a post-operative check as well.   LABORATORY DATA:  None for this visit.  DIAGNOSTIC IMAGING:  None for this visit. Reviewed PET scan from October 09, 2020.  ASSESSMENT AND PLAN:  Krystal Hill is a pleasant 75 y.o. 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 Helane Rima on 09/10/20. She presents today to review and discuss her survivorship care plan.  1. 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 She will follow-up with her  gynecologic oncologist, Dr. Jeral Pinch, with history and physical exam per surveillance protocol in September 2022. Today, a comprehensive survivorship care plan and treatment summary was reviewed with the patient today detailing her endometrial cancer diagnosis, treatment course, potential late/long-term effects of surgery, appropriate follow-up care with recommendations for the future, and patient education resources.  A copy of this summary will be sent to the patient's primary care provider via mail/fax/In Basket message after today's visit.    2. Problem(s) at Visit___Constipation, intermittent fatigue, insomnia_____. We discussed options to treat constipation. She is advised to begin taking miralax once a day or stool softeners to counteract the constipation caused by the iron. We discussed the need for the iron supplementation at this time. Also advised ferrous gluconate can sometimes be better tolerated and to discuss this option with her PCP. Discussed her recent lab work performed at her PCP visit including anemia  3. Bone health:  She has a bone density scan planned in the near future. She was given education on specific activities to promote bone health.  4. Cancer screening:  Due to Krystal Hill's history and her age, she should receive screening for skin cancers, colon cancer etc. She has results pending to a recent Cologuard. Our office will reach out to her PCP to follow up on these results. She denies smoking history. She has seen a dermatologist in the past in [place]. No new skin lesions reported. The information and recommendations are listed on the patient's comprehensive care plan/treatment summary and were reviewed in detail with the patient.    5. Health maintenance and wellness promotion: Krystal Hill is applauded on her 10 lb weight loss since surgery. She is encouraged to continue her efforts for increased physical activity and healthier dietary choices.    6. Support  services/counseling: It is not uncommon for this period of the patient's cancer care trajectory to be one of many emotions and stressors.  Krystal Hill was encouraged to take advantage of our many other support services programs, support groups, and classes. She was given information regarding our available services and encouraged to contact me with any questions or for help enrolling in any of our support group/programs.   Dispo:   -Return to cancer center in September 2022 with Dr. Berline Lopes. She is advised to call in June or July to arrange this. -Cologuard results pending. Our office will follow up on these results as well.  -She will reach out to her PCP to see about dose adjustment of the Metformin or medication change to something more tolerable. -Signs and symptoms of potential recurrence discussed and were provided in the care plan.  A total of (30) minutes of face-to-face time was spent with this patient with greater than 50% of that time in counseling and care-coordination.  Romeo  Hill [phone]   Note: PRIMARY CARE PROVIDER Luetta Nutting, [place] [phone]

## 2020-11-04 ENCOUNTER — Ambulatory Visit: Payer: Medicare PPO | Admitting: Family Medicine

## 2020-11-04 ENCOUNTER — Encounter: Payer: Self-pay | Admitting: Family Medicine

## 2020-11-04 ENCOUNTER — Other Ambulatory Visit: Payer: Self-pay

## 2020-11-04 DIAGNOSIS — E119 Type 2 diabetes mellitus without complications: Secondary | ICD-10-CM | POA: Diagnosis not present

## 2020-11-04 DIAGNOSIS — D509 Iron deficiency anemia, unspecified: Secondary | ICD-10-CM | POA: Insufficient documentation

## 2020-11-04 DIAGNOSIS — D500 Iron deficiency anemia secondary to blood loss (chronic): Secondary | ICD-10-CM | POA: Diagnosis not present

## 2020-11-04 DIAGNOSIS — Z1211 Encounter for screening for malignant neoplasm of colon: Secondary | ICD-10-CM | POA: Diagnosis not present

## 2020-11-04 MED ORDER — ACCU-CHEK AVIVA PLUS W/DEVICE KIT: PACK | 0 refills | Status: DC

## 2020-11-04 MED ORDER — ACCU-CHEK AVIVA PLUS VI STRP: ORAL_STRIP | 12 refills | Status: DC

## 2020-11-04 MED ORDER — DAPAGLIFLOZIN PROPANEDIOL 5 MG PO TABS: 5.0000 mg | ORAL_TABLET | Freq: Every day | ORAL | 3 refills | Status: DC

## 2020-11-04 MED ORDER — ACCU-CHEK MULTICLIX LANCETS MISC: 12 refills | Status: DC

## 2020-11-04 NOTE — Progress Notes (Signed)
Krystal Hill - 75 y.o. female MRN [id number]  Date of birth: [birth year]  Subjective Chief Complaint  Patient presents with  . Constipation  . Diabetes    HPI Krystal Hill is a 75 y.o. female here today for follow up of T2DM and anemia.  She reports that she has been released by oncology to return back home to [place].  She did have recent cologuard that returned positive.  She has been referred to GI for diagnostic colonoscopy.    She reports that she has felt "funny" with fuzzy thinking with metformin.  She has not been checking glucose regularly. She denies additional symptoms related to diabetes.   She has had constipation with iron supplement.  Wants to discuss alternatives.    ROS:  A comprehensive ROS was completed and negative except as noted per HPI  No Known Allergies  Past Medical History:  Diagnosis Date  . Arthritis   . Dyspnea    with exertion   . Endometrial cancer ([place])   . Obesity     Past Surgical History:  Procedure Laterality Date  . BREAST SURGERY    . CHOLECYSTECTOMY    . DILATION AND CURETTAGE OF UTERUS    . HYSTERECTOMY ABDOMINAL WITH SALPINGO-OOPHORECTOMY Bilateral 09/09/2020   Procedure: TOTAL ABDOMINAL HYSTERECTOMY WITH BILATERAL SALPINGO-OOPHORECTOMY WITH FROZEN SECTION;  Surgeon: Dian Queen, MD;  [facility];  Service: Gynecology;  Laterality: Bilateral;  . HYSTEROSCOPY WITH D & C N/A 05/07/2020   Procedure: DILATATION AND CURETTAGE;  Surgeon: Dian Queen, MD;  Location: [facility];  Service: Gynecology;  Laterality: N/A;    Social History   Socioeconomic History  . Marital status: Single    Spouse name: Not on file  . Number of children: 1  . Years of education: 46  . Highest education level: Not on file  Occupational History  . Occupation: Retired  Tobacco Use  . Smoking status: Never Smoker  . Smokeless tobacco: Never Used  Vaping Use  . Vaping Use: Never used  Substance and Sexual Activity  .  Alcohol use: Never  . Drug use: Never  . Sexual activity: Not Currently    Birth control/protection: Post-menopausal  Other Topics Concern  . Not on file  Social History Narrative  . Not on file   Social Determinants of Health   Financial Resource Strain: Not on file  Food Insecurity: Not on file  Transportation Needs: Not on file  Physical Activity: Not on file  Stress: Not on file  Social Connections: Not on file    Family History  Problem Relation Age of Onset  . Breast cancer Sister   . Breast cancer Niece   . Breast cancer Niece   . Colon cancer Neg Hx   . Ovarian cancer Neg Hx   . Prostate cancer Neg Hx   . Pancreatic cancer Neg Hx   . Endometrial cancer Neg Hx     Health Maintenance  Topic Date Due  . Hepatitis C Screening  Never done  . FOOT EXAM  Never done  . OPHTHALMOLOGY EXAM  Never done  . URINE MICROALBUMIN  Never done  . COLONOSCOPY (Pts 45-82yrs Insurance coverage will need to be confirmed)  Never done  . MAMMOGRAM  Never done  . DEXA SCAN  Never done  . PNA vac Low Risk Adult (1 of 2 - PCV13) 03/18/2021 (Originally 01/10/2013)  . TETANUS/TDAP  10/08/2021 (Originally 01/11/1967)  . COVID-19 Vaccine (4 - Booster for Pfizer series) 01/06/2021  .  INFLUENZA VACCINE  02/15/2021  . HEMOGLOBIN A1C  04/10/2021  . HPV VACCINES  Aged Out     ----------------------------------------------------------------------------------------------------------------------------------------------------------------------------------------------------------------- Physical Exam BP 133/74 (BP Location: Left Arm, Patient Position: Sitting, Cuff Size: Large)   Pulse (!) 105   Ht 5\' 2"  (1.575 m)   Wt 254 lb 4.8 oz (115.3 kg)   SpO2 96%   BMI 46.51 kg/m   Physical Exam Constitutional:      Appearance: Normal appearance.  Eyes:     General: No scleral icterus. Cardiovascular:     Rate and Rhythm: Normal rate and regular rhythm.  Pulmonary:     Effort: Pulmonary effort  is normal.     Breath sounds: Normal breath sounds.  Neurological:     General: No focal deficit present.     Mental Status: She is alert.  Psychiatric:        Mood and Affect: Mood normal.        Behavior: Behavior normal.     ------------------------------------------------------------------------------------------------------------------------------------------------------------------------------------------------------------------- Assessment and Plan  Type 2 diabetes mellitus without complications ([place]) She has not tolerated metformin well.  Will change to farxiga.  Side effects discussed and reviewed potential for genital yeast infections.  Glucometer rx sent.   Iron deficiency anemia Recommend change to ferrous gluconate or Slow-Fe to see if this is better tolerated.    Colon cancer screening Positive cologuard. She has been referred to GI.    Meds ordered this encounter  Medications  . dapagliflozin propanediol (FARXIGA) 5 MG TABS tablet    Sig: Take 1 tablet (5 mg total) by mouth daily before breakfast.    Dispense:  30 tablet    Refill:  3    Return in about 3 months (around 02/03/2021) for T2DM.    This visit occurred during the SARS-CoV-2 public health emergency.  Safety protocols were in place, including screening questions prior to the visit, additional usage of staff PPE, and extensive cleaning of exam room while observing appropriate contact time as indicated for disinfecting solutions.

## 2020-11-04 NOTE — Assessment & Plan Note (Signed)
Recommend change to ferrous gluconate or Slow-Fe to see if this is better tolerated.

## 2020-11-04 NOTE — Assessment & Plan Note (Signed)
She has not tolerated metformin well.  Will change to farxiga.  Side effects discussed and reviewed potential for genital yeast infections.  Glucometer rx sent.

## 2020-11-04 NOTE — Assessment & Plan Note (Signed)
Positive cologuard. She has been referred to GI.

## 2020-11-04 NOTE — Patient Instructions (Signed)
Try changing to ferrous gluconate or Slow Fe Replace metformin with farxiga.   Follow up with me in about 3 months if you are in the area.

## 2020-12-10 ENCOUNTER — Other Ambulatory Visit: Payer: Self-pay

## 2020-12-10 ENCOUNTER — Ambulatory Visit (AMBULATORY_SURGERY_CENTER): Payer: Self-pay | Admitting: *Deleted

## 2020-12-10 VITALS — Ht 62.0 in | Wt 253.0 lb

## 2020-12-10 DIAGNOSIS — R195 Other fecal abnormalities: Secondary | ICD-10-CM

## 2020-12-10 MED ORDER — NA SULFATE-K SULFATE-MG SULF 17.5-3.13-1.6 GM/177ML PO SOLN: 1.0000 | Freq: Once | ORAL | 0 refills | Status: AC

## 2020-12-10 NOTE — Progress Notes (Signed)
No egg or soy allergy known to patient  No issues with past sedation with any surgeries or procedures Patient denies ever being told they had issues or difficulty with intubation  No FH of Malignant Hyperthermia No diet pills per patient No home 02 use per patient  No blood thinners per patient  Pt denies issues with constipation  No A fib or A flutter  EMMI video to pt or via [place] 19 guidelines implemented in [place] today with Pt and RN  Pt is fully vaccinated  for Covid   Discussed with pt there will be an out-of-pocket cost for prep and that varies from $0 to 70 dollars   Due to the COVID-19 pandemic we are asking patients to follow certain guidelines.  Pt aware of COVID protocols and [place] guidelines   Patient accompanied by her niece who assisted with the pre visit.

## 2020-12-25 ENCOUNTER — Telehealth: Payer: Self-pay | Admitting: Gastroenterology

## 2020-12-25 DIAGNOSIS — R195 Other fecal abnormalities: Secondary | ICD-10-CM

## 2020-12-25 MED ORDER — SUPREP BOWEL PREP KIT 17.5-3.13-1.6 GM/177ML PO SOLN: 1.0000 | Freq: Once | ORAL | 0 refills | Status: AC

## 2020-12-25 NOTE — Telephone Encounter (Signed)
Suprep Rx resent to pharmacy.  LMOM that this was done

## 2020-12-25 NOTE — Telephone Encounter (Signed)
Patient called states [facility] does not have the Sulfate prep please resend

## 2020-12-28 ENCOUNTER — Encounter: Payer: Self-pay | Admitting: Certified Registered Nurse Anesthetist

## 2020-12-29 ENCOUNTER — Ambulatory Visit (AMBULATORY_SURGERY_CENTER): Payer: Medicare PPO | Admitting: Gastroenterology

## 2020-12-29 ENCOUNTER — Other Ambulatory Visit: Payer: Self-pay

## 2020-12-29 ENCOUNTER — Encounter: Payer: Self-pay | Admitting: Gastroenterology

## 2020-12-29 VITALS — BP 147/81 | HR 76 | Temp 98.4°F | Resp 23 | Ht 62.0 in

## 2020-12-29 DIAGNOSIS — R195 Other fecal abnormalities: Secondary | ICD-10-CM

## 2020-12-29 DIAGNOSIS — K5730 Diverticulosis of large intestine without perforation or abscess without bleeding: Secondary | ICD-10-CM | POA: Diagnosis not present

## 2020-12-29 DIAGNOSIS — D125 Benign neoplasm of sigmoid colon: Secondary | ICD-10-CM

## 2020-12-29 DIAGNOSIS — D120 Benign neoplasm of cecum: Secondary | ICD-10-CM | POA: Diagnosis not present

## 2020-12-29 DIAGNOSIS — K640 First degree hemorrhoids: Secondary | ICD-10-CM | POA: Diagnosis not present

## 2020-12-29 MED ORDER — SODIUM CHLORIDE 0.9 % IV SOLN
500.0000 mL | Freq: Once | INTRAVENOUS | Status: DC
Start: 2020-12-29 — End: 2020-12-29

## 2020-12-29 NOTE — Progress Notes (Signed)
VS by CW  I have reviewed the patient's medical history in detail and updated the computerized patient record.  

## 2020-12-29 NOTE — Progress Notes (Signed)
Called to room to assist during endoscopic procedure.  Patient ID and intended procedure confirmed with present staff. Received instructions for my participation in the procedure from the performing physician.  

## 2020-12-29 NOTE — Op Note (Signed)
[facility] Patient Name: Krystal Hill Procedure Date: 12/29/2020 8:42 AM MRN: [id number] Endoscopist: Gerrit Heck , MD Age: 75 Referring MD:  Date of Birth: [birth year] Gender: Female Account #: [id number] Procedure:                Colonoscopy Indications:              Positive Cologuard test                           No previous colonoscopy. Medicines:                Monitored Anesthesia Care Procedure:                Pre-Anesthesia Assessment:                           - Prior to the procedure, a History and Physical                            was performed, and patient medications and                            allergies were reviewed. The patient's tolerance of                            previous anesthesia was also reviewed. The risks                            and benefits of the procedure and the sedation                            options and risks were discussed with the patient.                            All questions were answered, and informed consent                            was obtained. Prior Anticoagulants: The patient has                            taken no previous anticoagulant or antiplatelet                            agents. ASA Grade Assessment: II - A patient with                            mild systemic disease. After reviewing the risks                            and benefits, the patient was deemed in                            satisfactory condition to undergo the procedure.  After obtaining informed consent, the colonoscope                            was passed under direct vision. Throughout the                            procedure, the patient's blood pressure, pulse, and                            oxygen saturations were monitored continuously. The                            Olympus CF-HQ190 [id number]) Colonoscope was                            introduced through the anus and advanced to the the                             terminal ileum. The colonoscopy was performed                            without difficulty. The patient tolerated the                            procedure well. The quality of the bowel                            preparation was good. The terminal ileum, ileocecal                            valve, appendiceal orifice, and rectum were                            photographed. Scope In: 8:48:44 AM Scope Out: 9:10:39 AM Scope Withdrawal Time: 0 hours 17 minutes 16 seconds  Total Procedure Duration: 0 hours 21 minutes 55 seconds  Findings:                 The perianal and digital rectal examinations were                            normal.                           Two sessile polyps were found in the sigmoid colon                            and cecum. The polyps were 4 to 5 mm in size. These                            polyps were removed with a cold snare. Resection                            and retrieval were complete. Estimated blood loss  was minimal.                           Multiple small and large-mouthed diverticula were                            found in the sigmoid colon.                           Non-bleeding internal hemorrhoids were found during                            retroflexion. The hemorrhoids were small.                           There was a small lipoma, in the transverse colon.                           The terminal ileum appeared normal. Complications:            No immediate complications. Estimated Blood Loss:     Estimated blood loss was minimal. Impression:               - Two 4 to 5 mm polyps in the sigmoid colon and in                            the cecum, removed with a cold snare. Resected and                            retrieved.                           - Diverticulosis in the sigmoid colon.                           - Non-bleeding internal hemorrhoids.                           - Small lipoma in the transverse  colon.                           - The examined portion of the ileum was normal. Recommendation:           - Patient has a contact number available for                            emergencies. The signs and symptoms of potential                            delayed complications were discussed with the                            patient. Return to normal activities tomorrow.                            Written discharge instructions were provided to the  patient.                           - Resume previous diet.                           - Continue present medications.                           - Await pathology results.                           - Repeat colonoscopy for surveillance based on                            pathology results.                           - Return to GI office PRN.                           - Use fiber, for example Citrucel, Fibercon, Konsyl                            or Metamucil. Gerrit Heck, MD 12/29/2020 9:14:41 AM

## 2020-12-29 NOTE — Patient Instructions (Signed)
Handouts given:  diverticulosis, polyps Resume previous diet Continue current medications Await pathology results Use fiber :  citrucel, fibercon, konsyl, metamucil  YOU HAD AN ENDOSCOPIC PROCEDURE TODAY AT [facility]:   Refer to the procedure report that was given to you for any specific questions about what was found during the examination.  If the procedure report does not answer your questions, please call your gastroenterologist to clarify.  If you requested that your care partner not be given the details of your procedure findings, then the procedure report has been included in a sealed envelope for you to review at your convenience later.  YOU SHOULD EXPECT: Some feelings of bloating in the abdomen. Passage of more gas than usual.  Walking can help get rid of the air that was put into your GI tract during the procedure and reduce the bloating. If you had a lower endoscopy (such as a colonoscopy or flexible sigmoidoscopy) you may notice spotting of blood in your stool or on the toilet paper. If you underwent a bowel prep for your procedure, you may not have a normal bowel movement for a few days.  Please Note:  You might notice some irritation and congestion in your nose or some drainage.  This is from the oxygen used during your procedure.  There is no need for concern and it should clear up in a day or so.  SYMPTOMS TO REPORT IMMEDIATELY:  Following lower endoscopy (colonoscopy or flexible sigmoidoscopy):  Excessive amounts of blood in the stool  Significant tenderness or worsening of abdominal pains  Swelling of the abdomen that is new, acute  Fever of 100F or higher  For urgent or emergent issues, a gastroenterologist can be reached at any hour by calling [phone]. Do not use MyChart messaging for urgent concerns.   diET:  We do recommend a small meal at first, but then you may proceed to your regular diet.  Drink plenty of fluids but you should avoid  alcoholic beverages for 24 hours.  ACTIVITY:  You should plan to take it easy for the rest of today and you should NOT DRIVE or use heavy machinery until tomorrow (because of the sedation medicines used during the test).    FOLLOW UP: Our staff will call the number listed on your records 48-72 hours following your procedure to check on you and address any questions or concerns that you may have regarding the information given to you following your procedure. If we do not reach you, we will leave a message.  We will attempt to reach you two times.  During this call, we will ask if you have developed any symptoms of COVID 19. If you develop any symptoms (ie: fever, flu-like symptoms, shortness of breath, cough etc.) before then, please call [phone].  If you test positive for Covid 19 in the 2 weeks post procedure, please call and report this information to [place].    If any biopsies were taken you will be contacted by phone or by letter within the next 1-3 weeks.  Please call us at [phone] if you have not heard about the biopsies in 3 weeks.   SIGNATURES/CONFIDENTIALITY: You and/or your care partner have signed paperwork which will be entered into your electronic medical record.  These signatures attest to the fact that that the information above on your After Visit Summary has been reviewed and is understood.  Full responsibility of the confidentiality of this discharge information lies with you and/or your  care-partner.  

## 2020-12-31 ENCOUNTER — Telehealth: Payer: Self-pay | Admitting: *Deleted

## 2020-12-31 NOTE — Telephone Encounter (Signed)
  Follow up Call-  Call back number 12/29/2020  Post procedure Call Back phone  # [phone]  Permission to leave phone message No     Patient questions:  Do you have a fever, pain , or abdominal swelling? No. Pain Score  0 *  Have you tolerated food without any problems? Yes.    Have you been able to return to your normal activities? Yes.    Do you have any questions about your discharge instructions: Diet   No. Medications  No. Follow up visit  No.  Do you have questions or concerns about your Care? No.  Actions: * If pain score is 4 or above: No action needed, pain <4.  Have you developed a fever since your procedure? no  2.   Have you had an respiratory symptoms (SOB or cough) since your procedure? no  3.   Have you tested positive for COVID 19 since your procedure no  4.   Have you had any family members/close contacts diagnosed with the COVID 19 since your procedure?  no   If yes to any of these questions please route to Joylene John, RN and Joella Prince, RN

## 2021-01-01 ENCOUNTER — Encounter: Payer: Self-pay | Admitting: Gastroenterology

## 2021-02-03 ENCOUNTER — Encounter: Payer: Self-pay | Admitting: Family Medicine

## 2021-02-03 ENCOUNTER — Ambulatory Visit: Payer: Medicare PPO | Admitting: Family Medicine

## 2021-02-03 ENCOUNTER — Other Ambulatory Visit: Payer: Self-pay

## 2021-02-03 VITALS — BP 123/76 | HR 98 | Ht 62.0 in | Wt 253.0 lb

## 2021-02-03 DIAGNOSIS — E119 Type 2 diabetes mellitus without complications: Secondary | ICD-10-CM

## 2021-02-03 LAB — POCT GLYCOSYLATED HEMOGLOBIN (HGB A1C): HbA1c, POC (controlled diabetic range): 6.9 % (ref 0.0–7.0)

## 2021-02-03 NOTE — Patient Instructions (Signed)
Great to see you today! A1c improved from 7.1% to 6.9%.  Continue to work on diet and stay active.   Schedule annual eye exam.  Follow up with me in 6 months.

## 2021-02-04 NOTE — Progress Notes (Signed)
Krystal Hill - 75 y.o. female MRN [id number]  Date of birth: 1945-07-23  Subjective Chief Complaint  Patient presents with   Diabetes    HPI Krystal Hill is a 75 year old female here today for follow-up of her diabetes.  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  PET scan was negative.  She has moved back to [place] however drove back up for follow-up visit.  She is not currently taking any medication for management of her diabetes.  She did not tolerate metformin well.  Wilder Glade was too costly for her.  She is not currently monitoring blood sugars at home.  She denies any symptoms related to her diabetes.  She has been doing better with her diet and staying active.  ROS:  A comprehensive ROS was completed and negative except as noted per HPI  No Known Allergies  Past Medical History:  Diagnosis Date   Arthritis    Dyspnea    with exertion    Endometrial cancer ([facility])    Obesity     Past Surgical History:  Procedure Laterality Date   BREAST SURGERY     CHOLECYSTECTOMY     DILATION AND CURETTAGE OF UTERUS     HYSTERECTOMY ABDOMINAL WITH SALPINGO-OOPHORECTOMY Bilateral 09/09/2020   Procedure: TOTAL ABDOMINAL HYSTERECTOMY WITH BILATERAL SALPINGO-OOPHORECTOMY WITH FROZEN SECTION;  Surgeon: Dian Queen, MD;  Location: [facility];  Service: Gynecology;  Laterality: Bilateral;   HYSTEROSCOPY WITH D & C N/A 05/07/2020   Procedure: DILATATION AND CURETTAGE;  Surgeon: Dian Queen, MD;  Location: [facility];  Service: Gynecology;  Laterality: N/A;    Social History   Socioeconomic History   Marital status: Single    Spouse name: Not on file   Number of children: 1   Years of education: 16   Highest education level: Not on file  Occupational History   Occupation: Retired  Tobacco Use   Smoking status: Never   Smokeless tobacco: Never  Vaping Use   Vaping Use: Never used  Substance and Sexual Activity   Alcohol use: Never   Drug use: Never    Sexual activity: Not Currently    Birth control/protection: Post-menopausal  Other Topics Concern   Not on file  Social History Narrative   Not on file   Social Determinants of Health   Financial Resource Strain: Not on file  Food Insecurity: Not on file  Transportation Needs: Not on file  Physical Activity: Not on file  Stress: Not on file  Social Connections: Not on file    Family History  Problem Relation Age of Onset   Breast cancer Sister    Breast cancer Niece    Breast cancer Niece    Colon cancer Neg Hx    Ovarian cancer Neg Hx    Prostate cancer Neg Hx    Pancreatic cancer Neg Hx    Endometrial cancer Neg Hx     Health Maintenance  Topic Date Due   FOOT EXAM  Never done   OPHTHALMOLOGY EXAM  Never done   URINE MICROALBUMIN  Never done   Hepatitis C Screening  Never done   Zoster Vaccines- Shingrix (1 of 2) Never done   MAMMOGRAM  Never done   DEXA SCAN  Never done   COVID-19 Vaccine (5 - Booster for Pfizer series) 11/06/2020   PNA vac Low Risk Adult (1 of 2 - PCV13) 03/18/2021 (Originally 01/10/2013)   TETANUS/TDAP  10/08/2021 (Originally [birth year]/1967)   INFLUENZA VACCINE  02/15/2021   HEMOGLOBIN  A1C  08/06/2021   COLONOSCOPY (Pts 45-64yrs Insurance coverage will need to be confirmed)  12/30/2027   HPV VACCINES  Aged Out     ----------------------------------------------------------------------------------------------------------------------------------------------------------------------------------------------------------------- Physical Exam BP 123/76   Pulse 98   Ht 5\' 2"  (1.575 m)   Wt 253 lb (114.8 kg)   SpO2 93%   BMI 46.27 kg/m   Physical Exam Constitutional:      Appearance: Normal appearance.  HENT:     Head: Normocephalic and atraumatic.  Eyes:     General: No scleral icterus. Cardiovascular:     Rate and Rhythm: Normal rate and regular rhythm.  Pulmonary:     Effort: Pulmonary effort is normal.     Breath sounds: Normal breath  sounds.  Musculoskeletal:     Cervical back: Neck supple.  Skin:    General: Skin is warm and dry.  Neurological:     General: No focal deficit present.     Mental Status: She is alert.  Psychiatric:        Mood and Affect: Mood normal.        Behavior: Behavior normal.    ------------------------------------------------------------------------------------------------------------------------------------------------------------------------------------------------------------------- Assessment and Plan  Type 2 diabetes mellitus without complications ([place]) Most recent A1c of  Lab Results  Component Value Date   HGBA1C 6.9 02/03/2021   indicates diabetes is well controlled.  she will dietary and exercise changes.  Counseled on healthy, low carb diet and recommend frequent activity to help with maintaining good control of blood sugars.    No orders of the defined types were placed in this encounter.   Return in about 6 months (around 08/06/2021) for T2DM.    This visit occurred during the SARS-CoV-2 public health emergency.  Safety protocols were in place, including screening questions prior to the visit, additional usage of staff PPE, and extensive cleaning of exam room while observing appropriate contact time as indicated for disinfecting solutions.

## 2021-02-04 NOTE — Assessment & Plan Note (Signed)
Most recent A1c of  Lab Results  Component Value Date   HGBA1C 6.9 02/03/2021   indicates diabetes is well controlled.  she will dietary and exercise changes.  Counseled on healthy, low carb diet and recommend frequent activity to help with maintaining good control of blood sugars.

## 2022-03-09 ENCOUNTER — Encounter: Payer: Self-pay | Admitting: General Practice

## 2022-07-11 IMAGING — PT NM PET TUM IMG INITIAL (PI) SKULL BASE T - THIGH
1 series · 1 of 1 positions shown · non-contrast
Comparison: None.

CLINICAL DATA: Initial treatment strategy for uterine cancer.

EXAM:
NUCLEAR MEDICINE PET SKULL BASE TO THIGH
TECHNIQUE: 12.7 mCi F-18 FDG was injected intravenously. Full-ring PET imaging
was performed from the skull base to thigh after the radiotracer. CT
data was obtained and used for attenuation correction and anatomic
localization.
Fasting blood glucose: 156 mg/dl

[Series 1088: results mm oncology reading · 1.3mm · 0.65mm/px · 1 of 1 slices shown]
[im 1/1]
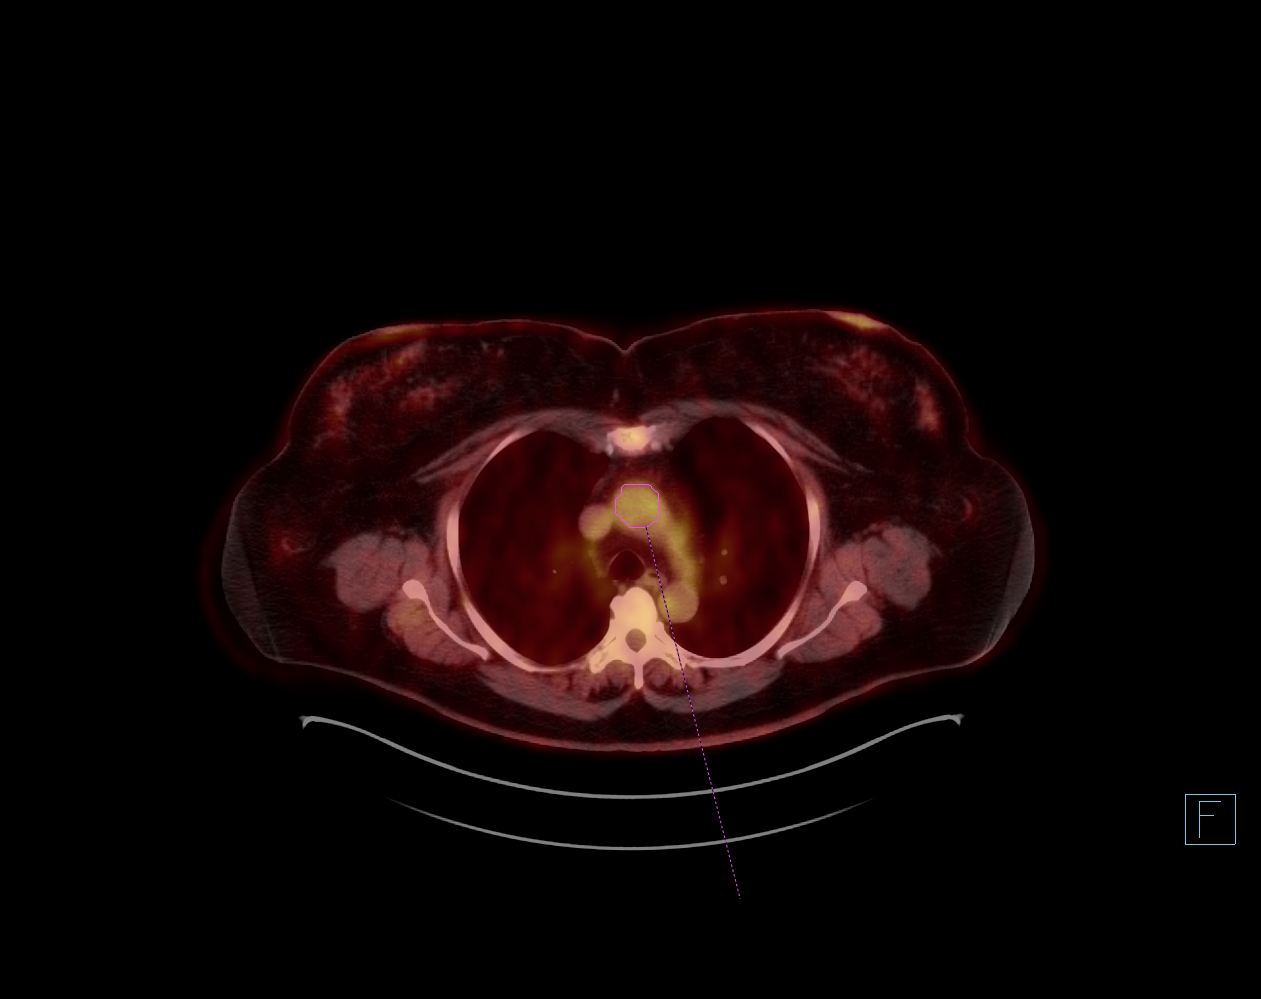

[1 of 1 positions shown; findings below may reference images not displayed]

FINDINGS: Mediastinal blood pool activity: SUV max

Liver activity: SUV max NA

NECK: No hypermetabolic lymph nodes in the neck.

Incidental CT findings: none

CHEST: No hypermetabolic mediastinal or hilar nodes. No suspicious
pulmonary nodules on the CT scan.

Incidental CT findings: Aortic atherosclerosis.

ABDOMEN/PELVIS: No abnormal hypermetabolic activity within the
liver, pancreas, adrenal glands, or spleen. No hypermetabolic lymph
nodes in the abdomen or pelvis.

Incidental CT findings: Aortic atherosclerosis. No aneurysm.
Exophytic cyst off the inferior pole of left kidney measures 3 cm.
No corresponding FDG uptake noted on the PET images. Postsurgical
changes identified within the pelvis. Status post hysterectomy and
bilateral salpingo oophorectomy. Increased uptake within the ventral
abdominal wall likely reflects postsurgical change.

SKELETON: No focal hypermetabolic activity to suggest skeletal
metastasis.

Incidental CT findings: none
IMPRESSION: 1. No specific findings identified to suggest FDG avid nodal or
solid organ metastasis.
